# Patient Record
Sex: Female | Born: 1965 | Race: Black or African American | Hispanic: No | Marital: Single | Smoking: Former smoker
Health system: Southern US, Community
[De-identification: ages and names within clinical notes are randomized; demographics above are authoritative.]

## PROBLEM LIST (undated history)

## (undated) DIAGNOSIS — J40 Bronchitis, not specified as acute or chronic: Secondary | ICD-10-CM

## (undated) DIAGNOSIS — I10 Essential (primary) hypertension: Secondary | ICD-10-CM

## (undated) DIAGNOSIS — R7303 Prediabetes: Secondary | ICD-10-CM

## (undated) DIAGNOSIS — E669 Obesity, unspecified: Secondary | ICD-10-CM

## (undated) DIAGNOSIS — E785 Hyperlipidemia, unspecified: Secondary | ICD-10-CM

## (undated) DIAGNOSIS — Z8049 Family history of malignant neoplasm of other genital organs: Secondary | ICD-10-CM

## (undated) DIAGNOSIS — Z803 Family history of malignant neoplasm of breast: Secondary | ICD-10-CM

## (undated) DIAGNOSIS — M1990 Unspecified osteoarthritis, unspecified site: Secondary | ICD-10-CM

## (undated) DIAGNOSIS — C55 Malignant neoplasm of uterus, part unspecified: Secondary | ICD-10-CM

## (undated) DIAGNOSIS — C801 Malignant (primary) neoplasm, unspecified: Secondary | ICD-10-CM

## (undated) HISTORY — DX: Hyperlipidemia, unspecified: E78.5

## (undated) HISTORY — PX: BREAST SURGERY: SHX581

## (undated) HISTORY — PX: REDUCTION MAMMAPLASTY: SUR839

## (undated) HISTORY — DX: Family history of malignant neoplasm of breast: Z80.3

## (undated) HISTORY — DX: Family history of malignant neoplasm of other genital organs: Z80.49

## (undated) HISTORY — DX: Malignant neoplasm of uterus, part unspecified: C55

## (undated) HISTORY — DX: Prediabetes: R73.03

## (undated) HISTORY — PX: CHOLECYSTECTOMY: SHX55

---

## 1998-03-01 ENCOUNTER — Inpatient Hospital Stay (HOSPITAL_COMMUNITY): Admission: AD | Admit: 1998-03-01 | Discharge: 1998-03-04 | Payer: Self-pay | Admitting: Obstetrics

## 1998-03-12 ENCOUNTER — Encounter: Admission: RE | Admit: 1998-03-12 | Discharge: 1998-03-12 | Payer: Self-pay | Admitting: Obstetrics

## 1998-10-15 ENCOUNTER — Emergency Department (HOSPITAL_COMMUNITY): Admission: EM | Admit: 1998-10-15 | Discharge: 1998-10-15 | Payer: Self-pay | Admitting: Emergency Medicine

## 2002-05-14 ENCOUNTER — Emergency Department (HOSPITAL_COMMUNITY): Admission: EM | Admit: 2002-05-14 | Discharge: 2002-05-15 | Payer: Self-pay | Admitting: *Deleted

## 2002-07-01 ENCOUNTER — Emergency Department (HOSPITAL_COMMUNITY): Admission: EM | Admit: 2002-07-01 | Discharge: 2002-07-01 | Payer: Self-pay | Admitting: Emergency Medicine

## 2003-03-11 ENCOUNTER — Emergency Department (HOSPITAL_COMMUNITY): Admission: EM | Admit: 2003-03-11 | Discharge: 2003-03-11 | Payer: Self-pay | Admitting: Emergency Medicine

## 2003-07-19 ENCOUNTER — Emergency Department (HOSPITAL_COMMUNITY): Admission: EM | Admit: 2003-07-19 | Discharge: 2003-07-19 | Payer: Self-pay | Admitting: Emergency Medicine

## 2004-05-20 ENCOUNTER — Ambulatory Visit (HOSPITAL_COMMUNITY): Admission: RE | Admit: 2004-05-20 | Discharge: 2004-05-20 | Payer: Self-pay | Admitting: Family Medicine

## 2004-08-25 ENCOUNTER — Emergency Department (HOSPITAL_COMMUNITY): Admission: EM | Admit: 2004-08-25 | Discharge: 2004-08-25 | Payer: Self-pay | Admitting: Emergency Medicine

## 2005-07-01 ENCOUNTER — Emergency Department (HOSPITAL_COMMUNITY): Admission: EM | Admit: 2005-07-01 | Discharge: 2005-07-01 | Payer: Self-pay | Admitting: Emergency Medicine

## 2006-09-05 ENCOUNTER — Emergency Department (HOSPITAL_COMMUNITY): Admission: EM | Admit: 2006-09-05 | Discharge: 2006-09-05 | Payer: Self-pay | Admitting: Emergency Medicine

## 2006-09-27 ENCOUNTER — Ambulatory Visit (HOSPITAL_COMMUNITY): Admission: RE | Admit: 2006-09-27 | Discharge: 2006-09-27 | Payer: Self-pay | Admitting: Orthopaedic Surgery

## 2007-03-22 ENCOUNTER — Emergency Department (HOSPITAL_COMMUNITY): Admission: EM | Admit: 2007-03-22 | Discharge: 2007-03-22 | Payer: Self-pay | Admitting: Emergency Medicine

## 2007-12-19 ENCOUNTER — Ambulatory Visit (HOSPITAL_COMMUNITY): Admission: RE | Admit: 2007-12-19 | Discharge: 2007-12-19 | Payer: Self-pay | Admitting: Orthopaedic Surgery

## 2007-12-31 ENCOUNTER — Encounter (HOSPITAL_COMMUNITY): Admission: RE | Admit: 2007-12-31 | Discharge: 2008-01-30 | Payer: Self-pay | Admitting: Orthopaedic Surgery

## 2009-07-22 ENCOUNTER — Emergency Department (HOSPITAL_COMMUNITY): Admission: EM | Admit: 2009-07-22 | Discharge: 2009-07-22 | Payer: Self-pay | Admitting: Emergency Medicine

## 2010-06-11 ENCOUNTER — Emergency Department (HOSPITAL_COMMUNITY): Admission: EM | Admit: 2010-06-11 | Discharge: 2010-06-11 | Payer: Self-pay | Admitting: Emergency Medicine

## 2010-08-01 ENCOUNTER — Emergency Department (HOSPITAL_COMMUNITY)
Admission: EM | Admit: 2010-08-01 | Discharge: 2010-08-01 | Payer: Self-pay | Source: Home / Self Care | Admitting: Emergency Medicine

## 2011-04-06 ENCOUNTER — Encounter: Payer: Self-pay | Admitting: *Deleted

## 2011-04-06 ENCOUNTER — Emergency Department (HOSPITAL_COMMUNITY)
Admission: EM | Admit: 2011-04-06 | Discharge: 2011-04-06 | Disposition: A | Discharge status: Home / Self Care | Payer: Self-pay | Attending: Emergency Medicine | Admitting: Emergency Medicine

## 2011-04-06 DIAGNOSIS — Y9210 Unspecified residential institution as the place of occurrence of the external cause: Secondary | ICD-10-CM | POA: Insufficient documentation

## 2011-04-06 DIAGNOSIS — I10 Essential (primary) hypertension: Secondary | ICD-10-CM | POA: Insufficient documentation

## 2011-04-06 DIAGNOSIS — S199XXA Unspecified injury of neck, initial encounter: Secondary | ICD-10-CM

## 2011-04-06 DIAGNOSIS — J45909 Unspecified asthma, uncomplicated: Secondary | ICD-10-CM | POA: Insufficient documentation

## 2011-04-06 DIAGNOSIS — S0993XA Unspecified injury of face, initial encounter: Secondary | ICD-10-CM | POA: Insufficient documentation

## 2011-04-06 DIAGNOSIS — S8000XA Contusion of unspecified knee, initial encounter: Secondary | ICD-10-CM | POA: Insufficient documentation

## 2011-04-06 HISTORY — DX: Essential (primary) hypertension: I10
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
## 2011-07-20 ENCOUNTER — Encounter (HOSPITAL_COMMUNITY): Payer: Self-pay | Admitting: *Deleted

## 2011-07-20 ENCOUNTER — Emergency Department (HOSPITAL_COMMUNITY)
Admission: EM | Admit: 2011-07-20 | Discharge: 2011-07-20 | Disposition: A | Discharge status: Home / Self Care | Payer: Medicaid Other | Attending: Emergency Medicine | Admitting: Emergency Medicine

## 2011-07-20 DIAGNOSIS — I10 Essential (primary) hypertension: Secondary | ICD-10-CM | POA: Insufficient documentation

## 2011-07-20 DIAGNOSIS — J111 Influenza due to unidentified influenza virus with other respiratory manifestations: Secondary | ICD-10-CM | POA: Insufficient documentation

## 2011-07-20 DIAGNOSIS — R059 Cough, unspecified: Secondary | ICD-10-CM | POA: Insufficient documentation

## 2011-07-20 DIAGNOSIS — R509 Fever, unspecified: Secondary | ICD-10-CM | POA: Insufficient documentation

## 2011-07-20 DIAGNOSIS — R05 Cough: Secondary | ICD-10-CM | POA: Insufficient documentation

## 2011-07-20 MED ORDER — OSELTAMIVIR PHOSPHATE 75 MG PO CAPS: 75.0000 mg | ORAL_CAPSULE | Freq: Two times a day (BID) | ORAL | Status: AC

## 2011-07-20 MED ORDER — ACETAMINOPHEN 500 MG PO TABS
1000.0000 mg | ORAL_TABLET | Freq: Once | ORAL | Status: AC
  Administered 2011-07-20: 1000 mg via ORAL
  Filled 2011-07-20: qty 2

## 2011-07-20 MED ORDER — OSELTAMIVIR PHOSPHATE 75 MG PO CAPS
75.0000 mg | ORAL_CAPSULE | Freq: Once | ORAL | Status: AC
  Administered 2011-07-20: 75 mg via ORAL
  Filled 2011-07-20: qty 1

## 2011-07-20 MED ORDER — IBUPROFEN 800 MG PO TABS
800.0000 mg | ORAL_TABLET | Freq: Once | ORAL | Status: AC
  Administered 2011-07-20: 800 mg via ORAL
  Filled 2011-07-20: qty 1

## 2011-07-20 NOTE — ED Provider Notes (Signed)
History     CSN: [id number] Arrival date & time: 07/20/2011  7:32 PM   First MD Initiated Contact with Patient 07/20/11 2045      Chief Complaint  Patient presents with  . Influenza    (Consider location/radiation/quality/duration/timing/severity/associated sxs/prior treatment) Patient is a 45 y.o. Allison [name] presenting with flu symptoms. The history is provided by the patient. No language interpreter was used.  Influenza This is a new problem. The current episode started yesterday. The problem occurs constantly. The problem has been unchanged. Associated symptoms include chills, coughing, a fever and myalgias.    Past Medical History  Diagnosis Date  . Hypertension   . Asthma     Past Surgical History  Procedure Date  . Breast surgery   . Cholecystectomy     No family history on file.  History  Substance Use Topics  . Smoking status: Never Smoker   . Smokeless tobacco: Not on file  . Alcohol Use: No    OB History    Grav Para Term Preterm Abortions TAB SAB Ect Mult Living                  Review of Systems  Constitutional: Positive for fever and chills.  Respiratory: Positive for cough.   Musculoskeletal: Positive for myalgias.  All other systems reviewed and are negative.    Allergies  Review of patient's allergies indicates no known allergies.  Home Medications   Current Outpatient Rx  Name Route Sig Dispense Refill  . ALBUTEROL SULFATE HFA 108 (90 BASE) MCG/ACT IN AERS Inhalation Inhale 2 puffs into the lungs 4 (four) times daily as needed. Tightness in chest and/or shortness of breath     . GUAIFENESIN-CODEINE 100-10 MG/5ML PO SYRP Oral Take 5 mLs by mouth 3 (three) times daily as needed. FOR COUGH     . HYDROCHLOROTHIAZIDE 25 MG PO TABS Oral Take 25 mg by mouth daily.        BP 114/97  Pulse 133  Temp(Src) 100.9 F (38.3 C) (Oral)  Resp 24  Ht 5\' 1"  (1.549 m)  Wt 220 lb (99.791 kg)  BMI Allison.57 kg/m2  SpO2 98%  LMP 07/13/2011  Physical  Exam  Nursing note and vitals reviewed. Constitutional: She is oriented to person, place, and time. She appears well-developed and well-nourished. No distress.  HENT:  Head: Normocephalic and atraumatic.  Right Ear: External ear normal.  Left Ear: External ear normal.  Nose: Nose normal.  Mouth/Throat: No oropharyngeal exudate.  Eyes: EOM are normal.  Neck: Normal range of motion.  Cardiovascular: Normal rate, regular rhythm and normal heart sounds.   Pulmonary/Chest: Effort normal and breath sounds normal. No accessory muscle usage. Not tachypneic. No respiratory distress. She has no decreased breath sounds. She has no wheezes. She has no rhonchi. She has no rales. She exhibits no tenderness.  Abdominal: Soft. She exhibits no distension. There is no tenderness.  Musculoskeletal: Normal range of motion.  Lymphadenopathy:    She has no cervical adenopathy.  Neurological: She is alert and oriented to person, place, and time.  Skin: Skin is warm and dry. She is not diaphoretic.  Psychiatric: She has a normal mood and affect. Judgment normal.    ED Course  Procedures (including critical care time)  Labs Reviewed - No data to display No results found.   No diagnosis found.    MDM          Worthy Rancher, PA 07/20/11 2109

## 2011-07-20 NOTE — ED Notes (Signed)
Cough,fever, chills,scratchy throat.  bil ear ache.

## 2011-07-20 NOTE — ED Notes (Signed)
Flu symptoms

## 2011-07-20 NOTE — ED Notes (Signed)
Alert, talking,sick since yesterday with fever, cough chills

## 2011-07-22 NOTE — ED Provider Notes (Signed)
Medical screening examination/treatment/procedure(s) were performed by non-physician practitioner and as supervising physician I was immediately available for consultation/collaboration.  Kashmere Daywalt S. Inell Mimbs, MD 07/22/11 0443 

## 2011-08-17 ENCOUNTER — Emergency Department (HOSPITAL_COMMUNITY): Payer: Medicaid Other

## 2011-08-17 ENCOUNTER — Other Ambulatory Visit: Payer: Self-pay

## 2011-08-17 ENCOUNTER — Encounter (HOSPITAL_COMMUNITY): Payer: Self-pay | Admitting: *Deleted

## 2011-08-17 ENCOUNTER — Emergency Department (HOSPITAL_COMMUNITY)
Admission: EM | Admit: 2011-08-17 | Discharge: 2011-08-17 | Disposition: A | Discharge status: Home / Self Care | Payer: Medicaid Other | Attending: Emergency Medicine | Admitting: Emergency Medicine

## 2011-08-17 DIAGNOSIS — R5381 Other malaise: Secondary | ICD-10-CM | POA: Insufficient documentation

## 2011-08-17 DIAGNOSIS — R29898 Other symptoms and signs involving the musculoskeletal system: Secondary | ICD-10-CM | POA: Insufficient documentation

## 2011-08-17 DIAGNOSIS — R51 Headache: Secondary | ICD-10-CM | POA: Insufficient documentation

## 2011-08-17 DIAGNOSIS — R5383 Other fatigue: Secondary | ICD-10-CM | POA: Insufficient documentation

## 2011-08-17 DIAGNOSIS — R404 Transient alteration of awareness: Secondary | ICD-10-CM | POA: Insufficient documentation

## 2011-08-17 DIAGNOSIS — R209 Unspecified disturbances of skin sensation: Secondary | ICD-10-CM | POA: Insufficient documentation

## 2011-08-17 DIAGNOSIS — I10 Essential (primary) hypertension: Secondary | ICD-10-CM | POA: Insufficient documentation

## 2011-08-17 DIAGNOSIS — R202 Paresthesia of skin: Secondary | ICD-10-CM

## 2011-08-17 DIAGNOSIS — J45909 Unspecified asthma, uncomplicated: Secondary | ICD-10-CM | POA: Insufficient documentation

## 2011-08-17 LAB — DIFFERENTIAL
Basophils Absolute: 0 10*3/uL (ref 0.0–0.1)
Basophils Relative: 0 % (ref 0–1)
Eosinophils Absolute: 0.1 10*3/uL (ref 0.0–0.7)
Eosinophils Relative: 2 % (ref 0–5)
Lymphocytes Relative: 45 % (ref 12–46)
Lymphs Abs: 3.6 10*3/uL (ref 0.7–4.0)
Monocytes Absolute: 0.5 10*3/uL (ref 0.1–1.0)
Monocytes Relative: 6 % (ref 3–12)
Neutro Abs: 3.7 10*3/uL (ref 1.7–7.7)
Neutrophils Relative %: 47 % (ref 43–77)

## 2011-08-17 LAB — CBC
HCT: 37.1 % (ref 36.0–46.0)
Hemoglobin: 12.3 g/dL (ref 12.0–15.0)
MCH: 30 pg (ref 26.0–34.0)
MCHC: 33.2 g/dL (ref 30.0–36.0)
MCV: 90.5 fL (ref 78.0–100.0)
Platelets: 399 10*3/uL (ref 150–400)
RBC: 4.1 MIL/uL (ref 3.87–5.11)
RDW: 14 % (ref 11.5–15.5)
WBC: 7.9 10*3/uL (ref 4.0–10.5)

## 2011-08-17 LAB — CARDIAC PANEL(CRET KIN+CKTOT+MB+TROPI)
CK, MB: 1.1 ng/mL (ref 0.3–4.0)
Relative Index: INVALID (ref 0.0–2.5)
Relative Index: INVALID (ref 0.0–2.5)
Total CK: 80 U/L (ref 7–177)
Total CK: 82 U/L (ref 7–177)
Troponin I: <0.3 ng/mL (ref <0.30)

## 2011-08-17 LAB — URINALYSIS, ROUTINE W REFLEX MICROSCOPIC
Bilirubin Urine: NEGATIVE
Glucose, UA: NEGATIVE mg/dL
Ketones, ur: NEGATIVE mg/dL
Leukocytes, UA: NEGATIVE
Nitrite: NEGATIVE
Protein, ur: NEGATIVE mg/dL
Specific Gravity, Urine: >1.03 — ABNORMAL HIGH (ref 1.005–1.030)
Urobilinogen, UA: 0.2 mg/dL (ref 0.0–1.0)
pH: 5.5 (ref 5.0–8.0)

## 2011-08-17 LAB — URINE MICROSCOPIC-ADD ON

## 2011-08-17 LAB — BASIC METABOLIC PANEL
BUN: 7 mg/dL (ref 6–23)
CO2: 31 mEq/L (ref 19–32)
Calcium: 9.8 mg/dL (ref 8.4–10.5)
Chloride: 99 mEq/L (ref 96–112)
Creatinine, Ser: 0.64 mg/dL (ref 0.50–1.10)
GFR calc Af Amer: >90 mL/min (ref >90)
GFR calc non Af Amer: >90 mL/min (ref >90)
Glucose, Bld: 119 mg/dL — ABNORMAL HIGH (ref 70–99)
Potassium: 3.3 mEq/L — ABNORMAL LOW (ref 3.5–5.1)
Sodium: 139 mEq/L (ref 135–145)

## 2011-08-17 LAB — PROTIME-INR
INR: 0.95 (ref 0.00–1.49)
Prothrombin Time: 12.9 seconds (ref 11.6–15.2)

## 2011-08-17 LAB — GLUCOSE, CAPILLARY: Glucose-Capillary: 115 mg/dL — ABNORMAL HIGH (ref 70–99)

## 2011-08-17 MED ORDER — GADOBENATE DIMEGLUMINE 529 MG/ML IV SOLN
20.0000 mL | Freq: Once | INTRAVENOUS | Status: AC | PRN
  Administered 2011-08-17: 20 mL via INTRAVENOUS

## 2011-08-17 NOTE — ED Notes (Signed)
ltt facial numbness.  Lt arm weak.

## 2011-08-17 NOTE — ED Provider Notes (Signed)
History   This chart was scribed for Glynn Octave, MD by Clarita Crane. The patient was seen in room [facility] and the patient's care was started at 5:44PM.   CSN: [id number]  Arrival date & time 08/17/11  1725   First MD Initiated Contact with Patient 08/17/11 1740      Chief Complaint  Patient presents with  . Numbness    (Consider location/radiation/quality/duration/timing/severity/associated sxs/prior treatment) HPI Allison [name] is a 46 y.o. female who presents to the Emergency Department complaining of constant moderate to severe left sided facial numbness radiating to LUE onset 2.5 hours ago while resting and persistent since with associated moderate to severe generalized weakness and drowsiness. Denies chest pain, SOB, diaphoresis. Patient with h/o HTN, Asthma.   Past Medical History  Diagnosis Date  . Hypertension   . Asthma     Past Surgical History  Procedure Date  . Breast surgery   . Cholecystectomy     History reviewed. No pertinent family history.  History  Substance Use Topics  . Smoking status: Never Smoker   . Smokeless tobacco: Not on file  . Alcohol Use: No    OB History    Grav Para Term Preterm Abortions TAB SAB Ect Mult Living                  Review of Systems 10 Systems reviewed and are negative for acute change except as noted in the HPI.  Allergies  Review of patient's allergies indicates no known allergies.  Home Medications   Current Outpatient Rx  Name Route Sig Dispense Refill  . ALBUTEROL SULFATE HFA 108 (90 BASE) MCG/ACT IN AERS Inhalation Inhale 2 puffs into the lungs 4 (four) times daily as needed. Tightness in chest and/or shortness of breath     . HYDROCHLOROTHIAZIDE 25 MG PO TABS Oral Take 25 mg by mouth daily.        BP 130/83  Pulse 94  Temp(Src) 97.7 F (36.5 C) (Oral)  Resp 20  SpO2 95%  LMP 08/17/2011  Physical Exam  Nursing note and vitals reviewed. Constitutional: She is oriented to person, place,  and time. She appears well-developed and well-nourished. No distress.  HENT:  Head: Normocephalic and atraumatic.  Eyes: EOM are normal. Pupils are equal, round, and reactive to light.  Neck: Neck supple. No tracheal deviation present.  Cardiovascular: Normal rate and regular rhythm.  Exam reveals no gallop and no friction rub.   No murmur heard. Pulmonary/Chest: Effort normal. No respiratory distress. She has no wheezes. She has no rales.  Abdominal: Soft. She exhibits no distension.  Musculoskeletal: Normal range of motion. She exhibits no edema.  Neurological: She is alert and oriented to person, place, and time. She has normal strength. No cranial nerve deficit or sensory deficit. Coordination normal.       No facial droop. No nystagmus. No pronator drift. Grip strength normal and equal bilaterally. Normal finger to nose. Positive Romberg. Subjective decreased pinprick to left side of face. No ataxia.   Skin: Skin is warm and dry.  Psychiatric: She has a normal mood and affect. Her behavior is normal.    ED Course  Procedures (including critical care time)  DIAGNOSTIC STUDIES: Oxygen Saturation is 97% on room air, normal by my interpretation.    COORDINATION OF CARE: 5:46PM- Patient informed of current plan for treatment in ED and agrees with plan set forth at this time.  9:16PM- Patient reports that she is still experiencing numbness to left  side of face and is very drowsy at this time.  Advised that we are still waiting on results of MRI.  10:04PM- Patient informed of MRI results which rule out possible stroke. Patient advised of need to follow up with neurologist regarding symptoms.   Labs Reviewed  BASIC METABOLIC PANEL - Abnormal; Notable for the following:    Potassium 3.3 (*)    Glucose, Bld 119 (*)    All other components within normal limits  URINALYSIS, ROUTINE W REFLEX MICROSCOPIC - Abnormal; Notable for the following:    Specific Gravity, Urine >1.030 (*)    Hgb  urine dipstick SMALL (*)    All other components within normal limits  GLUCOSE, CAPILLARY - Abnormal; Notable for the following:    Glucose-Capillary 115 (*)    All other components within normal limits  CBC  DIFFERENTIAL  PROTIME-INR  CARDIAC PANEL(CRET KIN+CKTOT+MB+TROPI)  CARDIAC PANEL(CRET KIN+CKTOT+MB+TROPI)  URINE MICROSCOPIC-ADD ON  POCT CBG MONITORING   Ct Head Wo Contrast  08/17/2011  *RADIOLOGY REPORT*  Clinical Data: Left-sided facial numbness.  No known injury.  CT HEAD WITHOUT CONTRAST  Technique:  Contiguous axial images were obtained from the base of the skull through the vertex without contrast.  Comparison: 07/01/2005.  Findings: There is no evidence for acute infarction, intracranial hemorrhage, mass lesion, hydrocephalus, or extra-axial fluid. There is no atrophy or white matter disease.  Calvarium is intact. The sinuses and mastoids are clear.  No infratemporal mass lesion is observed.  IMPRESSION: Negative.  No change from prior normal study.  Original Report Authenticated By: Elsie Stain, M.D.   Mr Angiogram Almyra Deforest Wo Contrast  08/17/2011  *RADIOLOGY REPORT*  Clinical Data:  Left arm weakness.  Facial numbness.  Rule out stroke  MRI HEAD WITHOUT AND WITH CONTRAST MRA HEAD WITHOUT CONTRAST MRA NECK WITHOUT AND WITH CONTRAST  Technique:  Multiplanar, multiecho pulse sequences of the brain and surrounding structures were obtained without and with intravenous contrast.  Angiographic images of the Circle of Willis were obtained using MRA technique without intravenous contrast. Angiographic images of the neck were obtained using MRA technique without and with intravenous contrast.  Carotid stenosis measurements (when applicable) are obtained utilizing NASCET criteria, using the distal internal carotid diameter as the denominator.  Contrast: 20mL MULTIHANCE GADOBENATE DIMEGLUMINE 529 MG/ML IV SOLN  Comparison:  CT 08/17/2011  MRI HEAD  Findings:  Negative for acute infarct.   Negative for significant chronic ischemia.  Negative for demyelinating disease.  Cerebral white matter is normal.  Brainstem is normal.  Negative for hemorrhage or mass lesion.  Postcontrast imaging of the brain reveals normal enhancement.  IMPRESSION: Negative  MRA HEAD  Findings: Both vertebral arteries are patent to the basilar.  Right PICA is patent.  Left PICA not visualized.  Left AICA is patent. Small right AICA.  Superior cerebellar and posterior cerebral arteries are patent bilaterally.  Internal carotid artery is patent bilaterally without stenosis. Posterior communicating artery is patent bilaterally.  Anterior and middle cerebral arteries are widely patent without significant stenosis.  Negative for cerebral aneurysm.  IMPRESSION: Negative  MRA NECK  Findings: Carotid bifurcation is widely patent bilaterally.  No evidence of atherosclerotic disease or dissection.  No significant carotid stenosis.  Both vertebral arteries are equal in size and are widely patent to the basilar without stenosis.  IMPRESSION: Negative  Original Report Authenticated By: Camelia Phenes, M.D.   Mr Angiogram Neck W Wo Contrast  08/17/2011  *RADIOLOGY REPORT*  Clinical Data:  Left arm weakness.  Facial numbness.  Rule out stroke  MRI HEAD WITHOUT AND WITH CONTRAST MRA HEAD WITHOUT CONTRAST MRA NECK WITHOUT AND WITH CONTRAST  Technique:  Multiplanar, multiecho pulse sequences of the brain and surrounding structures were obtained without and with intravenous contrast.  Angiographic images of the Circle of Willis were obtained using MRA technique without intravenous contrast. Angiographic images of the neck were obtained using MRA technique without and with intravenous contrast.  Carotid stenosis measurements (when applicable) are obtained utilizing NASCET criteria, using the distal internal carotid diameter as the denominator.  Contrast: 20mL MULTIHANCE GADOBENATE DIMEGLUMINE 529 MG/ML IV SOLN  Comparison:  CT 08/17/2011  MRI  HEAD  Findings:  Negative for acute infarct.  Negative for significant chronic ischemia.  Negative for demyelinating disease.  Cerebral white matter is normal.  Brainstem is normal.  Negative for hemorrhage or mass lesion.  Postcontrast imaging of the brain reveals normal enhancement.  IMPRESSION: Negative  MRA HEAD  Findings: Both vertebral arteries are patent to the basilar.  Right PICA is patent.  Left PICA not visualized.  Left AICA is patent. Small right AICA.  Superior cerebellar and posterior cerebral arteries are patent bilaterally.  Internal carotid artery is patent bilaterally without stenosis. Posterior communicating artery is patent bilaterally.  Anterior and middle cerebral arteries are widely patent without significant stenosis.  Negative for cerebral aneurysm.  IMPRESSION: Negative  MRA NECK  Findings: Carotid bifurcation is widely patent bilaterally.  No evidence of atherosclerotic disease or dissection.  No significant carotid stenosis.  Both vertebral arteries are equal in size and are widely patent to the basilar without stenosis.  IMPRESSION: Negative  Original Report Authenticated By: Camelia Phenes, M.D.   Mr Laqueta Jean QM Contrast  08/17/2011  *RADIOLOGY REPORT*  Clinical Data:  Left arm weakness.  Facial numbness.  Rule out stroke  MRI HEAD WITHOUT AND WITH CONTRAST MRA HEAD WITHOUT CONTRAST MRA NECK WITHOUT AND WITH CONTRAST  Technique:  Multiplanar, multiecho pulse sequences of the brain and surrounding structures were obtained without and with intravenous contrast.  Angiographic images of the Circle of Willis were obtained using MRA technique without intravenous contrast. Angiographic images of the neck were obtained using MRA technique without and with intravenous contrast.  Carotid stenosis measurements (when applicable) are obtained utilizing NASCET criteria, using the distal internal carotid diameter as the denominator.  Contrast: 20mL MULTIHANCE GADOBENATE DIMEGLUMINE 529 MG/ML IV  SOLN  Comparison:  CT 08/17/2011  MRI HEAD  Findings:  Negative for acute infarct.  Negative for significant chronic ischemia.  Negative for demyelinating disease.  Cerebral white matter is normal.  Brainstem is normal.  Negative for hemorrhage or mass lesion.  Postcontrast imaging of the brain reveals normal enhancement.  IMPRESSION: Negative  MRA HEAD  Findings: Both vertebral arteries are patent to the basilar.  Right PICA is patent.  Left PICA not visualized.  Left AICA is patent. Small right AICA.  Superior cerebellar and posterior cerebral arteries are patent bilaterally.  Internal carotid artery is patent bilaterally without stenosis. Posterior communicating artery is patent bilaterally.  Anterior and middle cerebral arteries are widely patent without significant stenosis.  Negative for cerebral aneurysm.  IMPRESSION: Negative  MRA NECK  Findings: Carotid bifurcation is widely patent bilaterally.  No evidence of atherosclerotic disease or dissection.  No significant carotid stenosis.  Both vertebral arteries are equal in size and are widely patent to the basilar without stenosis.  IMPRESSION: Negative  Original Report Authenticated By: DAVID C.  Chestine Spore, M.D.     1. Paresthesia       MDM  Subjective left-sided facial numbness for 2 hours with pain radiating to left side of face, neck and left arm. Did have fleeting chest pain that resolved. Patient feels subjectively weak in the left arm but not appreciate objectively.  She does have positive Romberg and is somewhat difficult historian and not very cooperative with neurological exam. Patient is not a TPA candidate given the minimal nature of her symptoms.  CT negative for acute abnormality. MRI negative for stroke, no evidence of stenosis or aneurysm.  Patient states paresthesias have improved but now she has pain in the left side of her face reading that her neck. Her left parotid gland is tender to palpation there is no dental  abnormality.  Given constellation of symptoms, doubt TIA. She stable for outpatient followup.   Date: 08/17/2011  Rate: 87  Rhythm: normal sinus rhythm  QRS Axis: normal  Intervals: normal  ST/T Wave abnormalities: nonspecific T wave changes  Conduction Disutrbances:none  Narrative Interpretation: T wave inversions V1 and V2, no comparison available  Old EKG Reviewed: none available      Medical screening examination/treatment/procedure(s) were performed by non-physician practitioner and as supervising physician I was immediately available for consultation/collaboration.            Glynn Octave, MD 08/17/11 [phone]

## 2011-08-17 NOTE — ED Notes (Signed)
Pt remains in MRI, report taken from Eligah East, RN

## 2012-03-01 ENCOUNTER — Emergency Department (HOSPITAL_COMMUNITY)
Admission: EM | Admit: 2012-03-01 | Discharge: 2012-03-01 | Disposition: A | Discharge status: Home / Self Care | Payer: Medicaid Other | Attending: Emergency Medicine | Admitting: Emergency Medicine

## 2012-03-01 ENCOUNTER — Encounter (HOSPITAL_COMMUNITY): Payer: Self-pay

## 2012-03-01 ENCOUNTER — Emergency Department (HOSPITAL_COMMUNITY): Payer: Medicaid Other

## 2012-03-01 DIAGNOSIS — I10 Essential (primary) hypertension: Secondary | ICD-10-CM | POA: Insufficient documentation

## 2012-03-01 DIAGNOSIS — J40 Bronchitis, not specified as acute or chronic: Secondary | ICD-10-CM

## 2012-03-01 DIAGNOSIS — J45909 Unspecified asthma, uncomplicated: Secondary | ICD-10-CM | POA: Insufficient documentation

## 2012-03-01 DIAGNOSIS — J069 Acute upper respiratory infection, unspecified: Secondary | ICD-10-CM | POA: Insufficient documentation

## 2012-03-01 MED ORDER — IPRATROPIUM BROMIDE 0.02 % IN SOLN
0.5000 mg | Freq: Once | RESPIRATORY_TRACT | Status: AC
  Administered 2012-03-01: 0.5 mg via RESPIRATORY_TRACT
  Filled 2012-03-01: qty 2.5

## 2012-03-01 MED ORDER — PROMETHAZINE-CODEINE 6.25-10 MG/5ML PO SYRP: 5.0000 mL | ORAL_SOLUTION | ORAL | Status: AC | PRN

## 2012-03-01 MED ORDER — PSEUDOEPHEDRINE HCL 60 MG PO TABS: ORAL_TABLET | ORAL | Status: DC

## 2012-03-01 MED ORDER — PREDNISONE 20 MG PO TABS
60.0000 mg | ORAL_TABLET | Freq: Once | ORAL | Status: AC
  Administered 2012-03-01: 60 mg via ORAL
  Filled 2012-03-01: qty 3

## 2012-03-01 MED ORDER — ALBUTEROL SULFATE (5 MG/ML) 0.5% IN NEBU
2.5000 mg | INHALATION_SOLUTION | Freq: Once | RESPIRATORY_TRACT | Status: AC
Start: 2012-03-01 — End: 2012-03-01
  Administered 2012-03-01: 2.5 mg via RESPIRATORY_TRACT
  Filled 2012-03-01: qty 0.5

## 2012-03-01 MED ORDER — HYDROCOD POLST-CHLORPHEN POLST 10-8 MG/5ML PO LQCR
5.0000 mL | Freq: Once | ORAL | Status: AC
  Administered 2012-03-01: 5 mL via ORAL
  Filled 2012-03-01: qty 5

## 2012-03-01 MED ORDER — ALBUTEROL SULFATE HFA 108 (90 BASE) MCG/ACT IN AERS: 2.0000 | INHALATION_SPRAY | RESPIRATORY_TRACT | Status: DC | PRN

## 2012-03-01 NOTE — ED Notes (Signed)
Pt reports being sick w/sore throat, cough, fever off/on, and bil earaches since sat. "feel like i'm getting worse".

## 2012-03-01 NOTE — ED Provider Notes (Signed)
History     CSN: [id number]  Arrival date & time 03/01/12  1000   First MD Initiated Contact with Patient 03/01/12 1026      Chief Complaint  Patient presents with  . Cough  . Sore Throat  . Fever  . Otalgia    (Consider location/radiation/quality/duration/timing/severity/associated sxs/prior treatment) Patient is a 46 y.o. female presenting with cough, pharyngitis, fever, and ear pain. The history is provided by the patient.  Cough This is a new problem. The current episode started more than 1 week ago. The problem occurs every few minutes. The problem has not changed since onset.The cough is non-productive. Associated symptoms include chills, sweats, ear pain, headaches, rhinorrhea, sore throat and myalgias. Pertinent negatives include no chest pain, no shortness of breath and no wheezing. She has tried decongestants and cough syrup for the symptoms. She is not a smoker. Her past medical history is significant for bronchitis and asthma.  Sore Throat Associated symptoms include chills, coughing, a fever, headaches, myalgias and a sore throat. Pertinent negatives include no abdominal pain, arthralgias, chest pain or neck pain.  Fever Primary symptoms of the febrile illness include fever, headaches, cough and myalgias. Primary symptoms do not include wheezing, shortness of breath, abdominal pain, dysuria or arthralgias.  The headache is not associated with photophobia.  Otalgia Associated symptoms include headaches, rhinorrhea, sore throat and cough. Pertinent negatives include no abdominal pain and no neck pain.    Past Medical History  Diagnosis Date  . Hypertension   . Asthma     Past Surgical History  Procedure Date  . Breast surgery   . Cholecystectomy     No family history on file.  History  Substance Use Topics  . Smoking status: Never Smoker   . Smokeless tobacco: Not on file  . Alcohol Use: No    OB History    Grav Para Term Preterm Abortions TAB SAB Ect  Mult Living                  Review of Systems  Constitutional: Positive for fever and chills. Negative for activity change.       All ROS Neg except as noted in HPI  HENT: Positive for ear pain, sore throat and rhinorrhea. Negative for nosebleeds and neck pain.   Eyes: Negative for photophobia and discharge.  Respiratory: Positive for cough. Negative for shortness of breath and wheezing.   Cardiovascular: Negative for chest pain and palpitations.  Gastrointestinal: Negative for abdominal pain and blood in stool.  Genitourinary: Negative for dysuria, frequency and hematuria.  Musculoskeletal: Positive for myalgias. Negative for back pain and arthralgias.  Skin: Negative.   Neurological: Positive for headaches. Negative for dizziness, seizures and speech difficulty.  Psychiatric/Behavioral: Negative for hallucinations and confusion.    Allergies  Review of patient's allergies indicates no known allergies.  Home Medications   Current Outpatient Rx  Name Route Sig Dispense Refill  . ALBUTEROL SULFATE HFA 108 (90 BASE) MCG/ACT IN AERS Inhalation Inhale 2 puffs into the lungs 4 (four) times daily as needed. Tightness in chest and/or shortness of breath     . HYDROCHLOROTHIAZIDE 25 MG PO TABS Oral Take 25 mg by mouth daily.        BP 123/80  Pulse 96  Temp 98.3 F (36.8 C) (Oral)  Resp 20  Ht 5\' 1"  (1.549 m)  Wt 225 lb (102.059 kg)  BMI 42.51 kg/m2  SpO2 98%  LMP 02/29/2012  Physical Exam  Nursing note and  vitals reviewed. Constitutional: She is oriented to person, place, and time. She appears well-developed and well-nourished.  Non-toxic appearance.  HENT:  Head: Normocephalic.  Right Ear: Tympanic membrane and external ear normal.  Left Ear: Tympanic membrane and external ear normal.       Nasal congestion. Pt is hoarse.  Eyes: EOM and lids are normal. Pupils are equal, round, and reactive to light.  Neck: Normal range of motion. Neck supple. Carotid bruit is not  present.       Sore with ROM exercises, no rigidity.  Cardiovascular: Normal rate, regular rhythm, normal heart sounds, intact distal pulses and normal pulses.   Pulmonary/Chest: No respiratory distress. She has wheezes. She has rhonchi.  Abdominal: Soft. Bowel sounds are normal. There is no tenderness. There is no guarding.  Musculoskeletal: Normal range of motion.  Lymphadenopathy:       Head (right side): No submandibular adenopathy present.       Head (left side): No submandibular adenopathy present.    She has no cervical adenopathy.  Neurological: She is alert and oriented to person, place, and time. She has normal strength. No cranial nerve deficit or sensory deficit.  Skin: Skin is warm and dry.  Psychiatric: She has a normal mood and affect. Her speech is normal.    ED Course  Procedures (including critical care time)   Labs Reviewed  RAPID STREP SCREEN   No results found.   No diagnosis found.    MDM  I have reviewed nursing notes, vital signs, and all appropriate lab and imaging results for this patient. Chest x-ray is negative for acute problem. Patient treated for bronchitis and upper respiratory infection. Prescription for albuterol 2 puffs every 4 hours, promethazine cough medication 5 mL every 4 hours as needed for cough, and Sudafed 3 times daily for congestion given to the patient. The patient is to follow with her primary physician next week, or return to the emergency department if any problems sooner.       Kathie Dike, [place] 03/01/12 1724

## 2012-03-01 NOTE — ED Notes (Signed)
Pt c/o bilateral ear ache, intermittent fever, sore throat, and cough x 6 days.

## 2012-03-02 NOTE — ED Provider Notes (Signed)
Medical screening examination/treatment/procedure(s) were performed by non-physician practitioner and as supervising physician I was immediately available for consultation/collaboration.  Flint Melter, MD 03/02/12 [id number]

## 2012-05-08 ENCOUNTER — Other Ambulatory Visit (HOSPITAL_COMMUNITY): Payer: Self-pay | Admitting: Nurse Practitioner

## 2012-05-08 DIAGNOSIS — Z139 Encounter for screening, unspecified: Secondary | ICD-10-CM

## 2012-05-15 ENCOUNTER — Ambulatory Visit (HOSPITAL_COMMUNITY)
Admission: RE | Admit: 2012-05-15 | Discharge: 2012-05-15 | Disposition: A | Discharge status: Home / Self Care | Payer: Medicaid Other | Source: Ambulatory Visit | Attending: Nurse Practitioner | Admitting: Nurse Practitioner

## 2012-05-15 DIAGNOSIS — Z139 Encounter for screening, unspecified: Secondary | ICD-10-CM

## 2012-05-15 DIAGNOSIS — Z1231 Encounter for screening mammogram for malignant neoplasm of breast: Secondary | ICD-10-CM | POA: Insufficient documentation

## 2012-08-30 ENCOUNTER — Encounter (HOSPITAL_COMMUNITY): Payer: Self-pay

## 2012-08-30 ENCOUNTER — Emergency Department (HOSPITAL_COMMUNITY): Payer: Medicaid Other

## 2012-08-30 ENCOUNTER — Emergency Department (HOSPITAL_COMMUNITY)
Admission: EM | Admit: 2012-08-30 | Discharge: 2012-08-30 | Disposition: A | Discharge status: Home / Self Care | Payer: Medicaid Other | Attending: Emergency Medicine | Admitting: Emergency Medicine

## 2012-08-30 DIAGNOSIS — J3489 Other specified disorders of nose and nasal sinuses: Secondary | ICD-10-CM | POA: Insufficient documentation

## 2012-08-30 DIAGNOSIS — J45909 Unspecified asthma, uncomplicated: Secondary | ICD-10-CM | POA: Insufficient documentation

## 2012-08-30 DIAGNOSIS — R6889 Other general symptoms and signs: Secondary | ICD-10-CM | POA: Insufficient documentation

## 2012-08-30 DIAGNOSIS — I10 Essential (primary) hypertension: Secondary | ICD-10-CM | POA: Insufficient documentation

## 2012-08-30 DIAGNOSIS — Z3202 Encounter for pregnancy test, result negative: Secondary | ICD-10-CM | POA: Insufficient documentation

## 2012-08-30 DIAGNOSIS — Z79899 Other long term (current) drug therapy: Secondary | ICD-10-CM | POA: Insufficient documentation

## 2012-08-30 DIAGNOSIS — H81399 Other peripheral vertigo, unspecified ear: Secondary | ICD-10-CM | POA: Insufficient documentation

## 2012-08-30 LAB — URINALYSIS, ROUTINE W REFLEX MICROSCOPIC
Glucose, UA: NEGATIVE mg/dL
Protein, ur: NEGATIVE mg/dL
pH: 6.5 (ref 5.0–8.0)

## 2012-08-30 LAB — URINE MICROSCOPIC-ADD ON

## 2012-08-30 LAB — POCT I-STAT, CHEM 8
BUN: 12 mg/dL (ref 6–23)
HCT: 37 % (ref 36.0–46.0)
Hemoglobin: 12.6 g/dL (ref 12.0–15.0)
Sodium: 137 mEq/L (ref 135–145)
TCO2: 30 mmol/L (ref 0–100)

## 2012-08-30 LAB — PREGNANCY, URINE: Preg Test, Ur: NEGATIVE

## 2012-08-30 MED ORDER — ONDANSETRON 8 MG PO TBDP
8.0000 mg | ORAL_TABLET | Freq: Once | ORAL | Status: AC | PRN
  Administered 2012-08-30: 8 mg via ORAL
  Filled 2012-08-30: qty 1

## 2012-08-30 MED ORDER — MECLIZINE HCL 25 MG PO TABS: 25.0000 mg | ORAL_TABLET | Freq: Three times a day (TID) | ORAL | Status: DC | PRN

## 2012-08-30 MED ORDER — MECLIZINE HCL 12.5 MG PO TABS
50.0000 mg | ORAL_TABLET | Freq: Once | ORAL | Status: AC
  Administered 2012-08-30: 50 mg via ORAL
  Filled 2012-08-30: qty 4

## 2012-08-30 NOTE — ED Provider Notes (Signed)
History     CSN: [id number]  Arrival date & time 08/30/12  1929   First MD Initiated Contact with Patient 08/30/12 1942      Chief Complaint  Patient presents with  . Dizziness     HPI Pt was seen at 1950.   Per pt, c/o sudden onset and persistence of constant "dizziness" that began this evening PTA.  Pt describes the dizziness as "swimmy headed" and "everything is moving." Symptoms worsen with moving her head side-to-side and position changes.  Has been associated with runny/stuffy nose, sinus and ears congestion for the past several days.  Denies sore throat, no fevers, no CP/palpitations, no SOB/cough, no abd pain, no N/V/D, no head/neck injury, no visual changes, no focal motor weakness, no tingling/numbness in extremities.     Past Medical History  Diagnosis Date  . Hypertension   . Asthma     Past Surgical History  Procedure Date  . Breast surgery   . Cholecystectomy     History  Substance Use Topics  . Smoking status: Never Smoker   . Smokeless tobacco: Not on file  . Alcohol Use: No      Review of Systems ROS: Statement: All systems negative except as marked or noted in the HPI; Constitutional: Negative for fever and chills. ; ; Eyes: Negative for eye pain, redness and discharge. ; ; ENMT: Negative for ear pain, hoarseness, sore throat. +nasal congestion, sinus pressure. ; ; Cardiovascular: Negative for chest pain, palpitations, diaphoresis, dyspnea and peripheral edema. ; ; Respiratory: Negative for cough, wheezing and stridor. ; ; Gastrointestinal: Negative for nausea, vomiting, diarrhea, abdominal pain, blood in stool, hematemesis, jaundice and rectal bleeding. . ; ; Genitourinary: Negative for dysuria, flank pain and hematuria. ; ; Musculoskeletal: Negative for back pain and neck pain. Negative for swelling and trauma.; ; Skin: Negative for pruritus, rash, abrasions, blisters, bruising and skin lesion.; ; Neuro: +vertigo. Negative for headache, lightheadedness and  neck stiffness. Negative for weakness, altered level of consciousness , altered mental status, extremity weakness, paresthesias, involuntary movement, seizure and syncope.       Allergies  Review of patient's allergies indicates no known allergies.  Home Medications   Current Outpatient Rx  Name  Route  Sig  Dispense  Refill  . ALBUTEROL SULFATE HFA 108 (90 BASE) MCG/ACT IN AERS   Inhalation   Inhale 2 puffs into the lungs every 4 (four) hours as needed for wheezing.   1 Inhaler   0   . HYDROCHLOROTHIAZIDE 25 MG PO TABS   Oral   Take 25 mg by mouth daily.           Marland Kitchen LOVASTATIN 20 MG PO TABS   Oral   Take 20 mg by mouth at bedtime.           BP 121/85  Pulse 101  Temp 98.9 F (37.2 C) (Oral)  Resp 16  Ht 5\' 1"  (1.549 m)  Wt 232 lb (105.235 kg)  BMI 43.84 kg/m2  SpO2 100%  LMP 08/19/2012  Physical Exam 1955: Physical examination:  Nursing notes reviewed; Vital signs and O2 SAT reviewed;  Constitutional: Well developed, Well nourished, Well hydrated, In no acute distress; Head:  Normocephalic, atraumatic; Eyes: EOMI, PERRL, No scleral icterus; ENMT: TM's clear bilat. +edemetous nasal turbinates bilat with clear rhinorrhea. Mouth and pharynx normal, Mucous membranes moist; Neck: Supple, Full range of motion, No lymphadenopathy; Cardiovascular: Regular rate and rhythm, No murmur, rub, or gallop; Respiratory: Breath sounds clear & equal  bilaterally, No rales, rhonchi, wheezes.  Speaking full sentences with ease, Normal respiratory effort/excursion; Chest: Nontender, Movement normal; Abdomen: Soft, Nontender, Nondistended, Normal bowel sounds; Genitourinary: No CVA tenderness; Extremities: Pulses normal, No tenderness, No edema, No calf edema or asymmetry.; Neuro: AA&Ox3, Major CN grossly intact.  Strength 5/5 equal bilat UE's and LE's.  DTR 2/4 equal bilat UE's and LE's.  No gross sensory deficits.  Normal cerebellar testing bilat UE's (finger-nose) and LE's (heel-shin).  Speech clear.  No facial droop.  +left gaze horizontal gaze fatigable nystagmus which reproduces pt's symptoms;; Skin: Color normal, Warm, Dry.   ED Course  Procedures     MDM  MDM Reviewed: previous chart, nursing note and vitals Reviewed previous: MRI Interpretation: CT scan and labs   Results for orders placed during the hospital encounter of 08/30/12  URINALYSIS, ROUTINE W REFLEX MICROSCOPIC      Component Value Range   Color, Urine YELLOW  YELLOW   APPearance CLEAR  CLEAR   Specific Gravity, Urine 1.010  1.005 - 1.030   pH 6.5  5.0 - 8.0   Glucose, UA NEGATIVE  NEGATIVE mg/dL   Hgb urine dipstick TRACE (*) NEGATIVE   Bilirubin Urine NEGATIVE  NEGATIVE   Ketones, ur NEGATIVE  NEGATIVE mg/dL   Protein, ur NEGATIVE  NEGATIVE mg/dL   Urobilinogen, UA 0.2  0.0 - 1.0 mg/dL   Nitrite NEGATIVE  NEGATIVE   Leukocytes, UA NEGATIVE  NEGATIVE  PREGNANCY, URINE      Component Value Range   Preg Test, Ur NEGATIVE  NEGATIVE  URINE MICROSCOPIC-ADD ON      Component Value Range   WBC, UA 0-2  <3 WBC/hpf   RBC / HPF 0-2  <3 RBC/hpf   Bacteria, UA RARE  RARE  POCT I-STAT, CHEM 8      Component Value Range   Sodium 137  135 - 145 mEq/L   Potassium 3.9  3.5 - 5.1 mEq/L   Chloride 100  96 - 112 mEq/L   BUN 12  6 - 23 mg/dL   Creatinine, Ser 0.45  0.50 - 1.10 mg/dL   Glucose, Bld 409 (*) 70 - 99 mg/dL   Calcium, Ion 8.11  9.14 - 1.23 mmol/L   TCO2 30  0 - 100 mmol/L   Hemoglobin 12.6  12.0 - 15.0 g/dL   HCT 78.2  95.6 - 21.3 %   Ct Head Wo Contrast 08/30/2012  *RADIOLOGY REPORT*  Clinical Data: Severe headache and dizziness.  CT HEAD WITHOUT CONTRAST  Technique:  Contiguous axial images were obtained from the base of the skull through the vertex without contrast.  Comparison: 07/01/2005 CT  Findings: No acute intracranial abnormalities are identified, including mass lesion or mass effect, hydrocephalus, extra-axial fluid collection, midline shift, hemorrhage, or acute infarction.   The visualized bony calvarium is unremarkable.  IMPRESSION: Unremarkable noncontrast head CT   Original Report Authenticated By: Harmon Pier, M.D.       2215:  Pt feels better after meds and wants to go home now. Not orthostatic.  Ambulated in the ED with steady gait.  Will tx symptomatically at this time. Dx and testing d/w pt and family.  Questions answered.  Verb understanding, agreeable to d/c home with outpt f/u.        Laray Anger, DO 09/02/12 1905

## 2012-08-30 NOTE — ED Notes (Signed)
Pt states she started feeling dizzy this evening while fixing dinner, denies pain or nausea

## 2013-09-20 ENCOUNTER — Encounter (HOSPITAL_COMMUNITY): Payer: Self-pay | Admitting: Emergency Medicine

## 2013-09-20 ENCOUNTER — Emergency Department (HOSPITAL_COMMUNITY)
Admission: EM | Admit: 2013-09-20 | Discharge: 2013-09-20 | Disposition: A | Discharge status: Home / Self Care | Payer: Medicaid Other | Attending: Emergency Medicine | Admitting: Emergency Medicine

## 2013-09-20 DIAGNOSIS — R51 Headache: Secondary | ICD-10-CM | POA: Insufficient documentation

## 2013-09-20 DIAGNOSIS — J45909 Unspecified asthma, uncomplicated: Secondary | ICD-10-CM | POA: Insufficient documentation

## 2013-09-20 DIAGNOSIS — I10 Essential (primary) hypertension: Secondary | ICD-10-CM | POA: Insufficient documentation

## 2013-09-20 DIAGNOSIS — R42 Dizziness and giddiness: Secondary | ICD-10-CM | POA: Insufficient documentation

## 2013-09-20 DIAGNOSIS — R519 Headache, unspecified: Secondary | ICD-10-CM

## 2013-09-20 DIAGNOSIS — E669 Obesity, unspecified: Secondary | ICD-10-CM | POA: Insufficient documentation

## 2013-09-20 DIAGNOSIS — Z79899 Other long term (current) drug therapy: Secondary | ICD-10-CM | POA: Insufficient documentation

## 2013-09-20 LAB — BASIC METABOLIC PANEL
BUN: 9 mg/dL (ref 6–23)
CHLORIDE: 99 meq/L (ref 96–112)
CO2: 28 meq/L (ref 19–32)
CREATININE: 0.77 mg/dL (ref 0.50–1.10)
Calcium: 9.2 mg/dL (ref 8.4–10.5)
GFR calc Af Amer: >90 mL/min (ref >90)
GFR calc non Af Amer: >90 mL/min (ref >90)
GLUCOSE: 120 mg/dL — AB (ref 70–99)
Potassium: 3.9 mEq/L (ref 3.7–5.3)
Sodium: 139 mEq/L (ref 137–147)

## 2013-09-20 MED ORDER — KETOROLAC TROMETHAMINE 30 MG/ML IJ SOLN
30.0000 mg | Freq: Once | INTRAMUSCULAR | Status: AC
  Administered 2013-09-20: 30 mg via INTRAMUSCULAR
  Filled 2013-09-20: qty 1

## 2013-09-20 MED ORDER — ACETAMINOPHEN 500 MG PO TABS
1000.0000 mg | ORAL_TABLET | Freq: Once | ORAL | Status: AC
  Administered 2013-09-20: 1000 mg via ORAL
  Filled 2013-09-20: qty 2

## 2013-09-20 MED ORDER — HYDROCHLOROTHIAZIDE 25 MG PO TABS: 25.0000 mg | ORAL_TABLET | Freq: Every day | ORAL | Status: DC

## 2013-09-20 MED ORDER — MECLIZINE HCL 12.5 MG PO TABS
25.0000 mg | ORAL_TABLET | Freq: Once | ORAL | Status: AC
  Administered 2013-09-20: 25 mg via ORAL
  Filled 2013-09-20: qty 2

## 2013-09-20 NOTE — ED Provider Notes (Signed)
CSN: [id number]     Arrival date & time 09/20/13  1053 History  This chart was scribed for Allison Hacker, MD by Adriana Reams, ED Scribe. This patient was seen in room APA19/[facility]19 and the patient's care was started at 1116.    First MD Initiated Contact with Patient 09/20/13 1116     Chief Complaint  Patient presents with  . Hypertension      The history is provided by the patient. No language interpreter was used.   HPI Comments: AMEL KITCH is a 48 y.o. female who presents to the Emergency Department complaining of HTN. She has been out of her prescription of hydrochlorothiazide 25 mg for 2 weeks. She reports associated HA (onset 3 days, rated 4/10) and feelings of mild dizziness. Denies room spinning dizziness.  She denies weakness, numbness, tingling, fever, chills, changes in balance or any other symptoms. She has not taken any medication for her symptoms. She has not been exposed to sick individuals. She denies a hx of migraines or tension HA.   Past Medical History  Diagnosis Date  . Hypertension   . Asthma    Past Surgical History  Procedure Laterality Date  . Breast surgery    . Cholecystectomy     History reviewed. No pertinent family history. History  Substance Use Topics  . Smoking status: Never Smoker   . Smokeless tobacco: Not on file  . Alcohol Use: No   OB History   Grav Para Term Preterm Abortions TAB SAB Ect Mult Living                 Review of Systems  Constitutional: Negative for fever.  Respiratory: Negative for cough, chest tightness and shortness of breath.   Cardiovascular: Negative for chest pain.  Gastrointestinal: Negative for nausea, vomiting and abdominal pain.  Genitourinary: Negative for dysuria.  Musculoskeletal: Negative for back pain and neck pain.  Skin: Negative for wound.  Neurological: Positive for dizziness and headaches. Negative for syncope, weakness and numbness.  Psychiatric/Behavioral: Negative for confusion.  All  other systems reviewed and are negative.      Allergies  Review of patient's allergies indicates no known allergies.  Home Medications   Current Outpatient Rx  Name  Route  Sig  Dispense  Refill  . albuterol (PROVENTIL HFA;VENTOLIN HFA) 108 (90 BASE) MCG/ACT inhaler   Inhalation   Inhale 2 puffs into the lungs every 4 (four) hours as needed for wheezing.   1 Inhaler   0   . hydrochlorothiazide 25 MG tablet   Oral   Take 25 mg by mouth daily.           Marland Kitchen lovastatin (MEVACOR) 20 MG tablet   Oral   Take 20 mg by mouth at bedtime.         . hydrochlorothiazide (HYDRODIURIL) 25 MG tablet   Oral   Take 1 tablet (25 mg total) by mouth daily.   15 tablet   0    BP 118/89  Pulse 105  Temp(Src) 97.8 F (36.6 C) (Oral)  Resp 20  SpO2 100%  Physical Exam  Nursing note and vitals reviewed. Constitutional: She is oriented to person, place, and time. She appears well-developed and well-nourished. No distress.  obese  HENT:  Head: Normocephalic and atraumatic.  Mouth/Throat: Oropharynx is clear and moist.  Eyes: EOM are normal. Pupils are equal, round, and reactive to light.  Neck: Neck supple.  Cardiovascular: Normal rate, regular rhythm and normal heart sounds.  No murmur heard. Pulmonary/Chest: Effort normal and breath sounds normal. No respiratory distress. She has no wheezes.  Abdominal: Soft. Bowel sounds are normal. There is no tenderness.  Neurological: She is alert and oriented to person, place, and time.  CN 2-12 intact.  5/5 strength in all 4 extremities.  No dysmetria to fnf  Skin: Skin is warm and dry.  Psychiatric: She has a normal mood and affect.    ED Course  Procedures (including critical care time) DIAGNOSTIC STUDIES: Oxygen Saturation is 100% on RA, normal by my interpretation.    COORDINATION OF CARE: 11:17 AM Discussed treatment plan which includes 1000 mg Tylenol and basic metabolic panel with pt at bedside and pt agreed to plan.     Labs Review Labs Reviewed  BASIC METABOLIC PANEL - Abnormal; Notable for the following:    Glucose, Bld 120 (*)    All other components within normal limits   Imaging Review No results found.  EKG Interpretation   None      Medications  acetaminophen (TYLENOL) tablet 1,000 mg (1,000 mg Oral Given 09/20/13 1155)  meclizine (ANTIVERT) tablet 25 mg (25 mg Oral Given 09/20/13 1155)  ketorolac (TORADOL) 30 MG/ML injection 30 mg (30 mg Intramuscular Given 09/20/13 1307)     MDM   Final diagnoses:  Headache    Patient presents with HA.  BP wnl.  Nonfocal on exam.  Patient given toradol, antivert and tylenol with improvement of symptoms.  Denies worst HA of life and no meningismus.  BMP wnl.  Will give Rx for HCTZ.  Patient to f/u with PCP.  After history, exam, and medical workup I feel the patient has been appropriately medically screened and is safe for discharge home. Pertinent diagnoses were discussed with the patient. Patient was given return precautions.  I personally performed the services described in this documentation, which was scribed in my presence. The recorded information has been reviewed and is accurate.    Allison Hacker, MD 09/20/13 1946

## 2013-09-20 NOTE — Discharge Instructions (Signed)
Migraine Headache A migraine headache is an intense, throbbing pain on one or both sides of your head. A migraine can last for 30 minutes to several hours. CAUSES  The exact cause of a migraine headache is not always known. However, a migraine may be caused when nerves in the brain become irritated and release chemicals that cause inflammation. This causes pain. Certain things may also trigger migraines, such as:  Alcohol.  Smoking.  Stress.  Menstruation.  Aged cheeses.  Foods or drinks that contain nitrates, glutamate, aspartame, or tyramine.  Lack of sleep.  Chocolate.  Caffeine.  Hunger.  Physical exertion.  Fatigue.  Medicines used to treat chest pain (nitroglycerine), birth control pills, estrogen, and some blood pressure medicines. SIGNS AND SYMPTOMS  Pain on one or both sides of your head.  Pulsating or throbbing pain.  Severe pain that prevents daily activities.  Pain that is aggravated by any physical activity.  Nausea, vomiting, or both.  Dizziness.  Pain with exposure to bright lights, loud noises, or activity.  General sensitivity to bright lights, loud noises, or smells. Before you get a migraine, you may get warning signs that a migraine is coming (aura). An aura may include:  Seeing flashing lights.  Seeing bright spots, halos, or zig-zag lines.  Having tunnel vision or blurred vision.  Having feelings of numbness or tingling.  Having trouble talking.  Having muscle weakness. DIAGNOSIS  A migraine headache is often diagnosed based on:  Symptoms.  Physical exam.  A CT scan or MRI of your head. These imaging tests cannot diagnose migraines, but they can help rule out other causes of headaches. TREATMENT Medicines may be given for pain and nausea. Medicines can also be given to help prevent recurrent migraines.  HOME CARE INSTRUCTIONS  Only take over-the-counter or prescription medicines for pain or discomfort as directed by your  health care provider. The use of long-term narcotics is not recommended.  Lie down in a dark, quiet room when you have a migraine.  Keep a journal to find out what may trigger your migraine headaches. For example, write down:  What you eat and drink.  How much sleep you get.  Any change to your diet or medicines.  Limit alcohol consumption.  Quit smoking if you smoke.  Get 7 9 hours of sleep, or as recommended by your health care provider.  Limit stress.  Keep lights dim if bright lights bother you and make your migraines worse. SEEK IMMEDIATE MEDICAL CARE IF:   Your migraine becomes severe.  You have a fever.  You have a stiff neck.  You have vision loss.  You have muscular weakness or loss of muscle control.  You start losing your balance or have trouble walking.  You feel faint or pass out.  You have severe symptoms that are different from your first symptoms. MAKE SURE YOU:   Understand these instructions.  Will watch your condition.  Will get help right away if you are not doing well or get worse. Document Released: 07/18/2005 Document Revised: 05/08/2013 Document Reviewed: 03/25/2013 [employer] Patient Information 2014 [employer].  

## 2013-09-20 NOTE — ED Notes (Signed)
Out of bp meds x 1 week. C/o headache. Dizziness " a little". Nad.

## 2014-04-16 ENCOUNTER — Encounter (HOSPITAL_COMMUNITY): Payer: Self-pay | Admitting: Emergency Medicine

## 2014-04-16 ENCOUNTER — Emergency Department (HOSPITAL_COMMUNITY)
Admission: EM | Admit: 2014-04-16 | Discharge: 2014-04-16 | Disposition: A | Discharge status: Home / Self Care | Payer: Medicaid Other | Attending: Emergency Medicine | Admitting: Emergency Medicine

## 2014-04-16 DIAGNOSIS — J45909 Unspecified asthma, uncomplicated: Secondary | ICD-10-CM | POA: Insufficient documentation

## 2014-04-16 DIAGNOSIS — J3489 Other specified disorders of nose and nasal sinuses: Secondary | ICD-10-CM | POA: Diagnosis present

## 2014-04-16 DIAGNOSIS — J069 Acute upper respiratory infection, unspecified: Secondary | ICD-10-CM | POA: Diagnosis not present

## 2014-04-16 DIAGNOSIS — M2550 Pain in unspecified joint: Secondary | ICD-10-CM | POA: Diagnosis not present

## 2014-04-16 DIAGNOSIS — IMO0001 Reserved for inherently not codable concepts without codable children: Secondary | ICD-10-CM | POA: Insufficient documentation

## 2014-04-16 DIAGNOSIS — Z79899 Other long term (current) drug therapy: Secondary | ICD-10-CM | POA: Insufficient documentation

## 2014-04-16 DIAGNOSIS — IMO0002 Reserved for concepts with insufficient information to code with codable children: Secondary | ICD-10-CM | POA: Insufficient documentation

## 2014-04-16 DIAGNOSIS — I10 Essential (primary) hypertension: Secondary | ICD-10-CM | POA: Diagnosis not present

## 2014-04-16 DIAGNOSIS — R0982 Postnasal drip: Secondary | ICD-10-CM | POA: Insufficient documentation

## 2014-04-16 MED ORDER — MUCINEX 600 MG PO TB12: 600.0000 mg | ORAL_TABLET | Freq: Two times a day (BID) | ORAL | Status: DC

## 2014-04-16 MED ORDER — FLUTICASONE PROPIONATE 50 MCG/ACT NA SUSP: 2.0000 | Freq: Every day | NASAL | Status: DC

## 2014-04-16 NOTE — Discharge Instructions (Signed)
Use nasal spray as prescribed along with taking mucinex. Continue over-the-counter cough medication. Rest and stay well hydrated.  Upper Respiratory Infection, Adult An upper respiratory infection (URI) is also sometimes known as the common cold. The upper respiratory tract includes the nose, sinuses, throat, trachea, and bronchi. Bronchi are the airways leading to the lungs. Most people improve within 1 week, but symptoms can last up to 2 weeks. A residual cough may last even longer.  CAUSES Many different viruses can infect the tissues lining the upper respiratory tract. The tissues become irritated and inflamed and often become very moist. Mucus production is also common. A cold is contagious. You can easily spread the virus to others by oral contact. This includes kissing, sharing a glass, coughing, or sneezing. Touching your mouth or nose and then touching a surface, which is then touched by another person, can also spread the virus. SYMPTOMS  Symptoms typically develop 1 to 3 days after you come in contact with a cold virus. Symptoms vary from person to person. They may include:  Runny nose.  Sneezing.  Nasal congestion.  Sinus irritation.  Sore throat.  Loss of voice (laryngitis).  Cough.  Fatigue.  Muscle aches.  Loss of appetite.  Headache.  Low-grade fever. DIAGNOSIS  You might diagnose your own cold based on familiar symptoms, since most people get a cold 2 to 3 times a year. Your caregiver can confirm this based on your exam. Most importantly, your caregiver can check that your symptoms are not due to another disease such as strep throat, sinusitis, pneumonia, asthma, or epiglottitis. Blood tests, throat tests, and X-rays are not necessary to diagnose a common cold, but they may sometimes be helpful in excluding other more serious diseases. Your caregiver will decide if any further tests are required. RISKS AND COMPLICATIONS  You may be at risk for a more severe case of  the common cold if you smoke cigarettes, have chronic heart disease (such as heart failure) or lung disease (such as asthma), or if you have a weakened immune system. The very young and very old are also at risk for more serious infections. Bacterial sinusitis, middle ear infections, and bacterial pneumonia can complicate the common cold. The common cold can worsen asthma and chronic obstructive pulmonary disease (COPD). Sometimes, these complications can require emergency medical care and may be life-threatening. PREVENTION  The best way to protect against getting a cold is to practice good hygiene. Avoid oral or hand contact with people with cold symptoms. Wash your hands often if contact occurs. There is no clear evidence that vitamin C, vitamin E, echinacea, or exercise reduces the chance of developing a cold. However, it is always recommended to get plenty of rest and practice good nutrition. TREATMENT  Treatment is directed at relieving symptoms. There is no cure. Antibiotics are not effective, because the infection is caused by a virus, not by bacteria. Treatment may include:  Increased fluid intake. Sports drinks offer valuable electrolytes, sugars, and fluids.  Breathing heated mist or steam (vaporizer or shower).  Eating chicken soup or other clear broths, and maintaining good nutrition.  Getting plenty of rest.  Using gargles or lozenges for comfort.  Controlling fevers with ibuprofen or acetaminophen as directed by your caregiver.  Increasing usage of your inhaler if you have asthma. Zinc gel and zinc lozenges, taken in the first 24 hours of the common cold, can shorten the duration and lessen the severity of symptoms. Pain medicines may help with fever, muscle  aches, and throat pain. A variety of non-prescription medicines are available to treat congestion and runny nose. Your caregiver can make recommendations and may suggest nasal or lung inhalers for other symptoms.  HOME CARE  INSTRUCTIONS   Only take over-the-counter or prescription medicines for pain, discomfort, or fever as directed by your caregiver.  Use a warm mist humidifier or inhale steam from a shower to increase air moisture. This may keep secretions moist and make it easier to breathe.  Drink enough water and fluids to keep your urine clear or pale yellow.  Rest as needed.  Return to work when your temperature has returned to normal or as your caregiver advises. You may need to stay home longer to avoid infecting others. You can also use a face mask and careful hand washing to prevent spread of the virus. SEEK MEDICAL CARE IF:   After the first few days, you feel you are getting worse rather than better.  You need your caregiver's advice about medicines to control symptoms.  You develop chills, worsening shortness of breath, or brown or red sputum. These may be signs of pneumonia.  You develop yellow or brown nasal discharge or pain in the face, especially when you bend forward. These may be signs of sinusitis.  You develop a fever, swollen neck glands, pain with swallowing, or white areas in the back of your throat. These may be signs of strep throat. SEEK IMMEDIATE MEDICAL CARE IF:   You have a fever.  You develop severe or persistent headache, ear pain, sinus pain, or chest pain.  You develop wheezing, a prolonged cough, cough up blood, or have a change in your usual mucus (if you have chronic lung disease).  You develop sore muscles or a stiff neck. Document Released: 01/11/2001 Document Revised: 10/10/2011 Document Reviewed: 10/23/2013 [facility] Patient Information 2015 [place], [place]. This information is not intended to replace advice given to you by your health care provider. Make sure you discuss any questions you have with your health care provider.

## 2014-04-16 NOTE — ED Notes (Signed)
Pt reports generalized body aches, fever, cough x 3 days.

## 2014-04-16 NOTE — ED Notes (Signed)
Flu like sx with body aches, sinus congestion, cough and sinus pressure x 2 days.

## 2014-04-16 NOTE — ED Provider Notes (Signed)
CSN: [id number]     Arrival date & time 04/16/14  1705 History   First MD Initiated Contact with Patient 04/16/14 1802     Chief Complaint  Patient presents with  . flu like sx      (Consider location/radiation/quality/duration/timing/severity/associated sxs/prior Treatment) HPI Comments: Patient is a 48 year old female with a past medical history of hypertension and asthma who presents to the emergency department complaining of nasal congestion, sinus pressure and cough x3-4 days. Cough is productive with clear and yellow mucus. States she is developing body aches and slight nausea. She can feel mucus dripping in the back of her throat. She has tried taking over-the-counter cough medication with temporary relief of her cough. Admits to subjective fever and chills. She took Tylenol earlier today which made her chills go away. Denies any sick contacts.  The history is provided by the patient.    Past Medical History  Diagnosis Date  . Hypertension   . Asthma    Past Surgical History  Procedure Laterality Date  . Breast surgery    . Cholecystectomy     No family history on file. History  Substance Use Topics  . Smoking status: Never Smoker   . Smokeless tobacco: Not on file  . Alcohol Use: No   OB History   Grav Para Term Preterm Abortions TAB SAB Ect Mult Living                 Review of Systems  Constitutional: Positive for fever and chills.  HENT: Positive for congestion, postnasal drip and sinus pressure.   Respiratory: Positive for cough.   Musculoskeletal: Positive for arthralgias and myalgias.  All other systems reviewed and are negative.     Allergies  Review of patient's allergies indicates no known allergies.  Home Medications   Prior to Admission medications   Medication Sig Start Date End Date Taking? Authorizing Provider  albuterol (PROVENTIL HFA;VENTOLIN HFA) 108 (90 BASE) MCG/ACT inhaler Inhale 2 puffs into the lungs every 4 (four) hours as needed  for wheezing. 03/01/12 09/20/13  Lenox Ahr, PA-C  fluticasone (FLONASE) 50 MCG/ACT nasal spray Place 2 sprays into both nostrils daily. 04/16/14   Illene Labrador, PA-C  hydrochlorothiazide (HYDRODIURIL) 25 MG tablet Take 1 tablet (25 mg total) by mouth daily. 09/20/13   Merryl Hacker, MD  hydrochlorothiazide 25 MG tablet Take 25 mg by mouth daily.      Historical Provider, MD  lovastatin (MEVACOR) 20 MG tablet Take 20 mg by mouth at bedtime.    Historical Provider, MD  MUCINEX 600 MG 12 hr tablet Take 1 tablet (600 mg total) by mouth 2 (two) times daily. 04/16/14   Illene Labrador, PA-C   BP 131/87  Pulse 93  Temp(Src) 98.6 F (37 C) (Oral)  Resp 16  Ht 5\' 1"  (1.549 m)  Wt 223 lb (101.152 kg)  BMI 42.16 kg/m2  SpO2 99%  LMP 04/09/2014 Physical Exam  Nursing note and vitals reviewed. Constitutional: She is oriented to person, place, and time. She appears well-developed and well-nourished. No distress.  HENT:  Head: Normocephalic and atraumatic.  Nasal mucosal edema. Post nasal drip. No post-oropharyngeal erythema, edema or exudate. No frontal or maxillary sinus tenderness.  Eyes: Conjunctivae are normal.  Neck: Normal range of motion. Neck supple.  Cardiovascular: Normal rate, regular rhythm and normal heart sounds.   Pulmonary/Chest: Effort normal and breath sounds normal. No respiratory distress. She has no wheezes. She has no rales.  Musculoskeletal: Normal  range of motion. She exhibits no edema.  Lymphadenopathy:    She has no cervical adenopathy.  Neurological: She is alert and oriented to person, place, and time.  Skin: Skin is warm and dry. She is not diaphoretic.  Psychiatric: She has a normal mood and affect. Her behavior is normal.    ED Course  Procedures (including critical care time) Labs Review Labs Reviewed - No data to display  Imaging Review No results found.   EKG Interpretation None      MDM   Final diagnoses:  URI (upper respiratory  infection)   Patient well-appearing in no apparent distress. Afebrile, vital signs stable. Lungs clear. Symptoms present for 3-4 days. I do not feel antibiotics are appropriate at this time. She was Mucinex, Flonase, saltwater gargles. Stable for discharge. Return precautions given. Patient states understanding of treatment care plan and is agreeable.  Illene Labrador, PA-C 04/16/14 [phone]

## 2014-04-17 NOTE — ED Provider Notes (Signed)
Medical screening examination/treatment/procedure(s) were performed by non-physician practitioner and as supervising physician I was immediately available for consultation/collaboration.   EKG Interpretation None        Fredia Sorrow, MD 04/17/14 [phone]

## 2014-05-07 ENCOUNTER — Other Ambulatory Visit (HOSPITAL_COMMUNITY): Payer: Self-pay | Admitting: Internal Medicine

## 2014-05-07 DIAGNOSIS — Z1231 Encounter for screening mammogram for malignant neoplasm of breast: Secondary | ICD-10-CM

## 2014-05-14 ENCOUNTER — Ambulatory Visit (HOSPITAL_COMMUNITY)
Admission: RE | Admit: 2014-05-14 | Discharge: 2014-05-14 | Disposition: A | Discharge status: Home / Self Care | Payer: Medicaid Other | Source: Ambulatory Visit | Attending: Internal Medicine | Admitting: Internal Medicine

## 2014-05-14 DIAGNOSIS — Z1231 Encounter for screening mammogram for malignant neoplasm of breast: Secondary | ICD-10-CM | POA: Insufficient documentation

## 2014-05-28 ENCOUNTER — Telehealth: Payer: Self-pay

## 2014-05-28 NOTE — Telephone Encounter (Signed)
Pt left VM to call to schedule her colonoscopy. Was referred by Dr. Legrand Rams.

## 2014-05-28 NOTE — Telephone Encounter (Signed)
Gastroenterology Pre-Procedure Review  Request Date: Requesting Physician: Dr.Fanta  PATIENT REVIEW QUESTIONS: The patient responded to the following health history questions as indicated:    1. Diabetes Melitis: NO 2. Joint replacements in the past 12 months: NO 3. Major health problems in the past 3 months: NO 4. Has an artificial valve or MVP: NO 5. Has a defibrillator: NO 6. Has been advised in past to take antibiotics in advance of a procedure like teeth cleaning: NO 7. Acholic:NO  8.Family History Colon Cancer: NO    MEDICATIONS & ALLERGIES:    Patient reports the following regarding taking any blood thinners:   Plavix? NO Aspirin? YES- BABY ASA Coumadin? NO  Patient confirms/reports the following medications:  Current Outpatient Prescriptions  Medication Sig Dispense Refill  . albuterol (PROVENTIL HFA;VENTOLIN HFA) 108 (90 BASE) MCG/ACT inhaler Inhale 2 puffs into the lungs every 4 (four) hours as needed for wheezing.  1 Inhaler  0  . fluticasone (FLONASE) 50 MCG/ACT nasal spray Place 2 sprays into both nostrils daily.  16 g  0  . hydrochlorothiazide (HYDRODIURIL) 25 MG tablet Take 1 tablet (25 mg total) by mouth daily.  15 tablet  0  . hydrochlorothiazide 25 MG tablet Take 25 mg by mouth daily.        Marland Kitchen lovastatin (MEVACOR) 20 MG tablet Take 20 mg by mouth at bedtime.      . MUCINEX 600 MG 12 hr tablet Take 1 tablet (600 mg total) by mouth 2 (two) times daily.  12 tablet  0   No current facility-administered medications for this visit.    Patient confirms/reports the following allergies:  No Known Allergies  No orders of the defined types were placed in this encounter.    AUTHORIZATION INFORMATION Primary Insurance: Medicaid   ID #:[id number]   Group #:  Pre-Cert / Auth required:  Pre-Cert / Auth #:    SCHEDULE INFORMATION: Procedure has been scheduled as follows:  Date: , Time: Location:   This Gastroenterology Pre-Precedure Review Form is being routed to  the following provider(s):    Would like Dr.Feilds

## 2014-06-11 ENCOUNTER — Other Ambulatory Visit: Payer: Self-pay

## 2014-06-11 DIAGNOSIS — Z1211 Encounter for screening for malignant neoplasm of colon: Secondary | ICD-10-CM

## 2014-06-11 NOTE — Telephone Encounter (Signed)
Pt is scheduled with Dr. Oneida Alar for colonoscopy on 07/15/2014 9:30 AM.

## 2014-06-17 ENCOUNTER — Encounter (HOSPITAL_COMMUNITY): Payer: Self-pay | Admitting: Emergency Medicine

## 2014-06-17 ENCOUNTER — Emergency Department (HOSPITAL_COMMUNITY)
Admission: EM | Admit: 2014-06-17 | Discharge: 2014-06-17 | Disposition: A | Discharge status: Home / Self Care | Payer: MEDICAID | Attending: Emergency Medicine | Admitting: Emergency Medicine

## 2014-06-17 DIAGNOSIS — Z7951 Long term (current) use of inhaled steroids: Secondary | ICD-10-CM | POA: Diagnosis not present

## 2014-06-17 DIAGNOSIS — J45909 Unspecified asthma, uncomplicated: Secondary | ICD-10-CM | POA: Diagnosis not present

## 2014-06-17 DIAGNOSIS — I10 Essential (primary) hypertension: Secondary | ICD-10-CM | POA: Diagnosis not present

## 2014-06-17 DIAGNOSIS — Z79899 Other long term (current) drug therapy: Secondary | ICD-10-CM | POA: Diagnosis not present

## 2014-06-17 DIAGNOSIS — G44209 Tension-type headache, unspecified, not intractable: Secondary | ICD-10-CM

## 2014-06-17 DIAGNOSIS — R51 Headache: Secondary | ICD-10-CM | POA: Diagnosis present

## 2014-06-17 MED ORDER — METOCLOPRAMIDE HCL 5 MG/ML IJ SOLN
10.0000 mg | Freq: Once | INTRAMUSCULAR | Status: AC
  Administered 2014-06-17: 10 mg via INTRAVENOUS
  Filled 2014-06-17: qty 2

## 2014-06-17 MED ORDER — DIPHENHYDRAMINE HCL 50 MG/ML IJ SOLN
12.5000 mg | Freq: Once | INTRAMUSCULAR | Status: AC
  Administered 2014-06-17: 12.5 mg via INTRAVENOUS
  Filled 2014-06-17: qty 1

## 2014-06-17 MED ORDER — TRAMADOL HCL 50 MG PO TABS: 50.0000 mg | ORAL_TABLET | Freq: Four times a day (QID) | ORAL | Status: DC | PRN

## 2014-06-17 MED ORDER — KETOROLAC TROMETHAMINE 30 MG/ML IJ SOLN
30.0000 mg | Freq: Once | INTRAMUSCULAR | Status: AC
  Administered 2014-06-17: 30 mg via INTRAVENOUS
  Filled 2014-06-17: qty 1

## 2014-06-17 NOTE — ED Notes (Signed)
PT reports headache on the left side x1 day with nausea. PT denies any injury. PT reports taking an aspirin and her HTN meds today.

## 2014-06-17 NOTE — ED Provider Notes (Signed)
CSN: [id number]     Arrival date & time 06/17/14  1628 History  First MD Initiated Contact with Patient 06/17/14 1922     Chief Complaint  Patient presents with  . Headache   HPI Patient presents to the emergency room with complaints of persistent left-sided headache for the last 1 day. The headache is located on the left side of her head and face. It moves down towards her left neck and shoulder. Movement and palpation makes it worse. She has not noticed any issues with photophobia. She has not had any fevers. She denies any nausea or vomiting. No numbness or weakness.Patient tried taking her daily 81 mg aspirin but no other medications for pain. Past Medical History  Diagnosis Date  . Hypertension   . Asthma    Past Surgical History  Procedure Laterality Date  . Breast surgery    . Cholecystectomy     No family history on file. History  Substance Use Topics  . Smoking status: Never Smoker   . Smokeless tobacco: Not on file  . Alcohol Use: No   OB History    No data available     Review of Systems  All other systems reviewed and are negative.     Allergies  Review of patient's allergies indicates no known allergies.  Home Medications   Prior to Admission medications   Medication Sig Start Date End Date Taking? Authorizing Provider  albuterol (PROVENTIL HFA;VENTOLIN HFA) 108 (90 BASE) MCG/ACT inhaler Inhale 2 puffs into the lungs every 4 (four) hours as needed for wheezing.    Lenox Ahr, PA-C  fluticasone (FLONASE) 50 MCG/ACT nasal spray Place 2 sprays into both nostrils daily. 04/16/14   Robyn M Hess, PA-C  hydrochlorothiazide (HYDRODIURIL) 25 MG tablet Take 1 tablet (25 mg total) by mouth daily. 09/20/13   Merryl Hacker, MD  hydrochlorothiazide 25 MG tablet Take 25 mg by mouth daily.      Historical Provider, MD  lovastatin (MEVACOR) 20 MG tablet Take 20 mg by mouth at bedtime.    Historical Provider, MD  MUCINEX 600 MG 12 hr tablet Take 1 tablet (600 mg  total) by mouth 2 (two) times daily. 04/16/14   Robyn M Hess, PA-C  traMADol (ULTRAM) 50 MG tablet Take 1 tablet (50 mg total) by mouth every 6 (six) hours as needed. 06/17/14   Dorie Rank, MD   BP 147/110 mmHg  Pulse 90  Temp(Src) 98.2 F (36.8 C) (Oral)  Resp 18  Ht 5\' 1"  (1.549 m)  Wt 229 lb (103.874 kg)  BMI 43.29 kg/m2  SpO2 96%  LMP 06/10/2014 Physical Exam  Constitutional: She appears well-developed and well-nourished. No distress.  HENT:  Head: Normocephalic and atraumatic.  Right Ear: Tympanic membrane and external ear normal.  Left Ear: Tympanic membrane and external ear normal.  Eyes: Conjunctivae are normal. Right eye exhibits no discharge. Left eye exhibits no discharge. No scleral icterus.  Neck: Normal range of motion. Neck supple. No rigidity. No tracheal deviation and normal range of motion present.  Cardiovascular: Normal rate, regular rhythm and intact distal pulses.   Pulmonary/Chest: Effort normal and breath sounds normal. No stridor. No respiratory distress. She has no wheezes. She has no rales.  Abdominal: Soft. Bowel sounds are normal. She exhibits no distension. There is no tenderness. There is no rebound and no guarding.  Musculoskeletal: She exhibits no edema or tenderness.  Tenderness to palpation left trapezius muscle no tenderness in the right  Neurological: She  is alert. She has normal strength. No cranial nerve deficit (no facial droop, extraocular movements intact, no slurred speech) or sensory deficit. She exhibits normal muscle tone. She displays no seizure activity. Coordination normal.  Skin: Skin is warm and dry. No rash noted.  Psychiatric: She has a normal mood and affect.  Nursing note and vitals reviewed.   ED Course  Procedures (including critical care time) Medications  ketorolac (TORADOL) 30 MG/ML injection 30 mg (30 mg Intravenous Given 06/17/14 1947)  metoCLOPramide (REGLAN) injection 10 mg (10 mg Intravenous Given 06/17/14 1946)   diphenhydrAMINE (BENADRYL) injection 12.5 mg (12.5 mg Intravenous Given 06/17/14 1948)     MDM   Final diagnoses:  Tension headache    Symptoms consistent with tension headache.  Improved with treatment in the ED.  Doubt meningitis, SAH, cerebral hemorrhage or other acute emergency medical condition.  Follow up with PCP.  Warning signs and precautions discussed    Dorie Rank, MD 06/17/14 2025

## 2014-06-17 NOTE — Discharge Instructions (Signed)

## 2014-06-18 NOTE — Telephone Encounter (Signed)
MOVI PREP SPLIT DOSING, REGULAR BREAKFAST. CLEAR LIQUIDS AFTER 9 AM.  

## 2014-06-20 MED ORDER — PEG-KCL-NACL-NASULF-NA ASC-C 100 G PO SOLR: 1.0000 | ORAL | Status: DC

## 2014-06-20 NOTE — Telephone Encounter (Signed)
Rx sent to the pharmacy and instructions mailed to pt.  

## 2014-06-30 ENCOUNTER — Other Ambulatory Visit: Payer: Self-pay

## 2014-07-09 ENCOUNTER — Telehealth: Payer: Self-pay

## 2014-07-09 NOTE — Telephone Encounter (Signed)
Called pt to update triage. She said there has been no change in her medications since she was triaged.

## 2014-07-15 ENCOUNTER — Ambulatory Visit (HOSPITAL_COMMUNITY)
Admission: RE | Admit: 2014-07-15 | Discharge: 2014-07-15 | Disposition: A | Discharge status: Home / Self Care | Payer: Medicaid Other | Source: Ambulatory Visit | Attending: Gastroenterology | Admitting: Gastroenterology

## 2014-07-15 ENCOUNTER — Encounter (HOSPITAL_COMMUNITY): Payer: Self-pay | Admitting: *Deleted

## 2014-07-15 ENCOUNTER — Encounter (HOSPITAL_COMMUNITY)
Admission: RE | Disposition: A | Discharge status: Home / Self Care | Payer: Self-pay | Source: Ambulatory Visit | Attending: Gastroenterology

## 2014-07-15 DIAGNOSIS — K644 Residual hemorrhoidal skin tags: Secondary | ICD-10-CM | POA: Insufficient documentation

## 2014-07-15 DIAGNOSIS — I10 Essential (primary) hypertension: Secondary | ICD-10-CM | POA: Insufficient documentation

## 2014-07-15 DIAGNOSIS — D122 Benign neoplasm of ascending colon: Secondary | ICD-10-CM | POA: Insufficient documentation

## 2014-07-15 DIAGNOSIS — D123 Benign neoplasm of transverse colon: Secondary | ICD-10-CM | POA: Diagnosis not present

## 2014-07-15 DIAGNOSIS — Z1211 Encounter for screening for malignant neoplasm of colon: Secondary | ICD-10-CM | POA: Insufficient documentation

## 2014-07-15 DIAGNOSIS — D120 Benign neoplasm of cecum: Secondary | ICD-10-CM | POA: Insufficient documentation

## 2014-07-15 DIAGNOSIS — J45909 Unspecified asthma, uncomplicated: Secondary | ICD-10-CM | POA: Diagnosis not present

## 2014-07-15 HISTORY — PX: COLONOSCOPY: SHX5424

## 2014-07-15 SURGERY — COLONOSCOPY
Anesthesia: Moderate Sedation

## 2014-07-15 MED ORDER — MIDAZOLAM HCL 5 MG/5ML IJ SOLN
INTRAMUSCULAR | Status: DC | PRN
  Administered 2014-07-15: 1 mg via INTRAVENOUS
  Administered 2014-07-15 (×3): 2 mg via INTRAVENOUS

## 2014-07-15 MED ORDER — STERILE WATER FOR IRRIGATION IR SOLN
Status: DC | PRN
  Administered 2014-07-15: 13:00:00

## 2014-07-15 MED ORDER — MEPERIDINE HCL 100 MG/ML IJ SOLN
INTRAMUSCULAR | Status: DC | PRN
  Administered 2014-07-15 (×3): 25 mg via INTRAVENOUS

## 2014-07-15 MED ORDER — SODIUM CHLORIDE 0.9 % IV SOLN
INTRAVENOUS | Status: DC
  Administered 2014-07-15: 12:00:00 via INTRAVENOUS

## 2014-07-15 MED ORDER — MEPERIDINE HCL 100 MG/ML IJ SOLN
INTRAMUSCULAR | Status: AC
  Filled 2014-07-15: qty 2

## 2014-07-15 MED ORDER — MIDAZOLAM HCL 5 MG/5ML IJ SOLN
INTRAMUSCULAR | Status: AC
  Filled 2014-07-15: qty 10

## 2014-07-15 NOTE — H&P (Signed)
  Primary Care Physician:  Rosita Fire, MD Primary Gastroenterologist:  Dr. Oneida Alar  Pre-Procedure History & Physical: HPI:  Allison [name] is a 48 y.o. female here for [place].  Past Medical History  Diagnosis Date  . Hypertension   . Asthma     Past Surgical History  Procedure Laterality Date  . Breast surgery    . Cholecystectomy      Prior to Admission medications   Medication Sig Start Date End Date Taking? Authorizing Provider  albuterol (PROVENTIL HFA;VENTOLIN HFA) 108 (90 BASE) MCG/ACT inhaler Inhale 2 puffs into the lungs every 4 (four) hours as needed for wheezing.   Yes Lenox Ahr, PA-C  hydrochlorothiazide 25 MG tablet Take 25 mg by mouth daily.     Yes Historical Provider, MD  lovastatin (MEVACOR) 20 MG tablet Take 20 mg by mouth at bedtime.   Yes Historical Provider, MD  peg 3350 powder (MOVIPREP) 100 G SOLR Take 1 kit (200 g total) by mouth as directed. 06/20/14  Yes Danie Binder, MD  traMADol (ULTRAM) 50 MG tablet Take 1 tablet (50 mg total) by mouth every 6 (six) hours as needed. 06/17/14  Yes Dorie Rank, MD    Allergies as of 06/11/2014  . (No Known Allergies)    History reviewed. No pertinent family history.  History   Social History  . Marital Status: Single    Spouse Name: N/A    Number of Children: N/A  . Years of Education: N/A   Occupational History  . Not on file.   Social History Main Topics  . Smoking status: Never Smoker   . Smokeless tobacco: Not on file  . Alcohol Use: No  . Drug Use: No  . Sexual Activity: Not on file   Other Topics Concern  . Not on file   Social History Narrative    Review of Systems: See HPI, otherwise negative ROS   Physical Exam: BP 131/81 mmHg  Pulse 95  Temp(Src) 98 F (36.7 C) (Oral)  Resp 18  Ht _0  (1.549 m)  Wt 225 lb (102.059 kg)  BMI 42.54 kg/m2  SpO2 94% General:   Alert,  pleasant and cooperative in NAD Head:  Normocephalic and atraumatic. Neck:   Supple; Lungs:  Clear throughout to auscultation.    Heart:  Regular rate and rhythm. Abdomen:  Soft, nontender and nondistended. Normal bowel sounds, without guarding, and without rebound.   Neurologic:  Alert and  oriented x4;  grossly normal neurologically.  Impression/Plan:    SCREENING  Plan:  1. TCS TODAY

## 2014-07-15 NOTE — Discharge Instructions (Signed)
You had 3 polyps removed.    FOLLOW A HIGH FIBER DIET. AVOID ITEMS THAT CAUSE BLOATING. SEE INFO BELOW.  YOUR BIOPSY WILL BE BACK IN 14 DAYS OR YOU CAN LOOK THEM UP ON MY CHART AFTER DEC 17.  Next colonoscopy in 1-3 years. YOUR SISTERS, BROTHERS, CHILDREN, AND PARENTS NEED TO HAVE A COLONOSCOPY STARTING AT THE AGE OF 40.   Colonoscopy Care After Read the instructions outlined below and refer to this sheet in the next week. These discharge instructions provide you with general information on caring for yourself after you leave the hospital. While your treatment has been planned according to the most current medical practices available, unavoidable complications occasionally occur. If you have any problems or questions after discharge, call DR. Jodey Burbano, [phone].  ACTIVITY  You may resume your regular activity, but move at a slower pace for the next 24 hours.   Take frequent rest periods for the next 24 hours.   Walking will help get rid of the air and reduce the bloated feeling in your belly (abdomen).   No driving for 24 hours (because of the medicine (anesthesia) used during the test).   You may shower.   Do not sign any important legal documents or operate any machinery for 24 hours (because of the anesthesia used during the test).    NUTRITION  Drink plenty of fluids.   You may resume your normal diet as instructed by your doctor.   Begin with a light meal and progress to your normal diet. Heavy or fried foods are harder to digest and may make you feel sick to your stomach (nauseated).   Avoid alcoholic beverages for 24 hours or as instructed.    MEDICATIONS  You may resume your normal medications.   WHAT YOU CAN EXPECT TODAY  Some feelings of bloating in the abdomen.   Passage of more gas than usual.   Spotting of blood in your stool or on the toilet paper  .  IF YOU HAD POLYPS REMOVED DURING THE COLONOSCOPY:  Eat a soft diet IF YOU HAVE NAUSEA, BLOATING,  ABDOMINAL PAIN, OR VOMITING.    FINDING OUT THE RESULTS OF YOUR TEST Not all test results are available during your visit. DR. Oneida Alar WILL CALL YOU WITHIN 14 DAYS OF YOUR PROCEDUE WITH YOUR RESULTS. Do not assume everything is normal if you have not heard from DR. Shamira Toutant, CALL HER OFFICE AT [phone].  SEEK IMMEDIATE MEDICAL ATTENTION AND CALL THE OFFICE: [phone] IF:  You have more than a spotting of blood in your stool.   Your belly is swollen (abdominal distention).   You are nauseated or vomiting.   You have a temperature over 101F.   You have abdominal pain or discomfort that is severe or gets worse throughout the day.   High-Fiber Diet A high-fiber diet changes your normal diet to include more whole grains, legumes, fruits, and vegetables. Changes in the diet involve replacing refined carbohydrates with unrefined foods. The calorie level of the diet is essentially unchanged. The Dietary Reference Intake (recommended amount) for adult males is 38 grams per day. For adult females, it is 25 grams per day. Pregnant and lactating women should consume 28 grams of fiber per day. Fiber is the intact part of a plant that is not broken down during digestion. Functional fiber is fiber that has been isolated from the plant to provide a beneficial effect in the body. PURPOSE  Increase stool bulk.   Ease and regulate bowel movements.  Lower cholesterol.  INDICATIONS THAT YOU NEED MORE FIBER  Constipation and hemorrhoids.   Uncomplicated diverticulosis (intestine condition) and irritable bowel syndrome.   Weight management.   As a protective measure against hardening of the arteries (atherosclerosis), diabetes, and cancer.   GUIDELINES FOR INCREASING FIBER IN THE DIET  Start adding fiber to the diet slowly. A gradual increase of about 5 more grams (2 slices of whole-wheat bread, 2 servings of most fruits or vegetables, or 1 bowl of high-fiber cereal) per day is best. Too rapid  an increase in fiber may result in constipation, flatulence, and bloating.   Drink enough water and fluids to keep your urine clear or pale yellow. Water, juice, or caffeine-free drinks are recommended. Not drinking enough fluid may cause constipation.   Eat a variety of high-fiber foods rather than one type of fiber.   Try to increase your intake of fiber through using high-fiber foods rather than fiber pills or supplements that contain small amounts of fiber.   The goal is to change the types of food eaten. Do not supplement your present diet with high-fiber foods, but replace foods in your present diet.  INCLUDE A VARIETY OF FIBER SOURCES  Replace refined and processed grains with whole grains, canned fruits with fresh fruits, and incorporate other fiber sources. White rice, white breads, and most bakery goods contain little or no fiber.   Brown whole-grain rice, buckwheat oats, and many fruits and vegetables are all good sources of fiber. These include: broccoli, Brussels sprouts, cabbage, cauliflower, beets, sweet potatoes, white potatoes (skin on), carrots, tomatoes, eggplant, squash, berries, fresh fruits, and dried fruits.   Cereals appear to be the richest source of fiber. Cereal fiber is found in whole grains and bran. Bran is the fiber-rich outer coat of cereal grain, which is largely removed in refining. In whole-grain cereals, the bran remains. In breakfast cereals, the largest amount of fiber is found in those with "bran" in their names. The fiber content is sometimes indicated on the label.   You may need to include additional fruits and vegetables each day.   In baking, for 1 cup white flour, you may use the following substitutions:   1 cup whole-wheat flour minus 2 tablespoons.   1/2 cup white flour plus 1/2 cup whole-wheat flour.   Polyps, Colon  A polyp is extra tissue that grows inside your body. Colon polyps grow in the large intestine. The large intestine, also called  the colon, is part of your digestive system. It is a long, hollow tube at the end of your digestive tract where your body makes and stores stool. Most polyps are not dangerous. They are benign. This means they are not cancerous. But over time, some types of polyps can turn into cancer. Polyps that are smaller than a pea are usually not harmful. But larger polyps could someday become or may already be cancerous. To be safe, doctors remove all polyps and test them.   WHO GETS POLYPS? Anyone can get polyps, but certain people are more likely than others. You may have a greater chance of getting polyps if:  You are over 50.   You have had polyps before.   Someone in your family has had polyps.   Someone in your family has had cancer of the large intestine.   Find out if someone in your family has had polyps. You may also be more likely to get polyps if you:   Eat a lot of fatty foods  Smoke   Drink alcohol   Do not exercise  Eat too much   TREATMENT  The caregiver will remove the polyp during sigmoidoscopy or colonoscopy.    PREVENTION There is not one sure way to prevent polyps. You might be able to lower your risk of getting them if you:  Eat more fruits and vegetables and less fatty food.   Do not smoke.   Avoid alcohol.   Exercise every day.   Lose weight if you are overweight.   Eating more calcium and folate can also lower your risk of getting polyps. Some foods that are rich in calcium are milk, cheese, and broccoli. Some foods that are rich in folate are chickpeas, kidney beans, and spinach.

## 2014-07-16 NOTE — Op Note (Addendum)
[facility] [address] [place], [address]   COLONOSCOPY PROCEDURE REPORT  PATIENT: Allison, [name]  MR#: [id number] BIRTHDATE: 1965-09-05 , 48  yrs. old GENDER: female ENDOSCOPIST: Barney Drain, MD REFERRED ER:[id number] Fanta, M.D. PROCEDURE DATE:  07/15/2014 PROCEDURE:   Colonoscopy with snare polypectomy and Colonoscopy with cold biopsy polypectomy INDICATIONS:average risk for colon cancer. MEDICATIONS: Demerol 75 mg IV and Versed 7 mg IV  DESCRIPTION OF PROCEDURE:    Physical exam was performed.  Informed consent was obtained from the patient after explaining the benefits, risks, and alternatives to procedure.  The patient was connected to monitor and placed in left lateral position. Continuous oxygen was provided by nasal cannula and IV medicine administered through an indwelling cannula.  After administration of sedation and rectal exam, the patients rectum was intubated and the EC-[id number] ([id number])  colonoscope was advanced under direct visualization to the cecum.  The scope was removed slowly by carefully examining the color, texture, anatomy, and integrity mucosa on the way out.  The patient was recovered in endoscopy and discharged home in satisfactory condition.    COLON FINDINGS: Two sessile polyps ranging from 3 to 18mm in size were found in the proximal transverse colon and at the cecum.  A polypectomy was performed with cold forceps.  , A sessile polyp measuring 1.2 cm in size was found in the ascending colon.  A polypectomy was performed with cold forceps.  A polypectomy was performed using snare cautery AND POLYP RETRIEVED WITH ROTH NET. The examination was otherwise normal.  , and Small external hemorrhoids were found.  PREP QUALITY: good.  CECAL W/D TIME: 16 mins          COMPLICATIONS: None  ENDOSCOPIC IMPRESSION: 1.   THREE COLON POLYPS REMOVED 2.    Small external hemorrhoids  RECOMMENDATIONS: FOLLOW A HIGH FIBER DIET.  AVOID  ITEMS THAT CAUSE BLOATING. AWAIT BIOPSY. Next colonoscopy in 1-3 years with OSMOPREP PER PT REQUEST.  ALL SISTERS, BROTHERS, CHILDREN, AND PARENTS NEED TO HAVE A COLONOSCOPY STARTING AT THE AGE OF 40.     _______________________________ Lorrin MaisBarney Drain, MD 08-13-14 5:20 PM Revised: 08-13-2014 5:20 PM   CPT CODES: ICD CODES:  The ICD and CPT codes recommended by this software are interpretations from the data that the clinical staff has captured with the software.  The verification of the translation of this report to the ICD and CPT codes and modifiers is the sole responsibility of the health care institution and practicing physician where this report was generated.  [employer] will not be held responsible for the validity of the ICD and CPT codes included on this report.  AMA assumes no liability for data contained or not contained herein. CPT is a Designer, television/film set of the [employer].

## 2014-07-16 NOTE — Telephone Encounter (Signed)
REVIEWED-NO ADDITIONAL RECOMMENDATIONS. 

## 2014-07-22 ENCOUNTER — Encounter (HOSPITAL_COMMUNITY): Payer: Self-pay | Admitting: Gastroenterology

## 2014-08-04 ENCOUNTER — Telehealth: Payer: Self-pay | Admitting: Gastroenterology

## 2014-08-04 NOTE — Telephone Encounter (Signed)
Tried to call and not accepting calls at this time.

## 2014-08-04 NOTE — Telephone Encounter (Signed)
Please call pt. She had THREE simple adenomas removed.   FOLLOW A HIGH FIBER DIET. AVOID ITEMS THAT CAUSE BLOATING.  Next colonoscopy in 3 years. ALL SISTERS, BROTHERS, CHILDREN, AND PARENTS NEED TO HAVE A COLONOSCOPY STARTING AT THE AGE OF 40.

## 2014-08-05 NOTE — Telephone Encounter (Signed)
Reminder in epic °

## 2014-08-05 NOTE — Telephone Encounter (Signed)
Letter mailed to pt to call for results.  

## 2014-08-12 NOTE — Telephone Encounter (Signed)
Pt called and was informed of her results. High Fiber diet mailed to pt per her request.

## 2015-10-29 ENCOUNTER — Emergency Department (HOSPITAL_COMMUNITY)
Admission: EM | Admit: 2015-10-29 | Discharge: 2015-10-30 | Disposition: A | Discharge status: Home / Self Care | Payer: Medicaid Other | Attending: Emergency Medicine | Admitting: Emergency Medicine

## 2015-10-29 ENCOUNTER — Encounter (HOSPITAL_COMMUNITY): Payer: Self-pay

## 2015-10-29 ENCOUNTER — Emergency Department (HOSPITAL_COMMUNITY): Payer: Medicaid Other

## 2015-10-29 DIAGNOSIS — Y939 Activity, unspecified: Secondary | ICD-10-CM | POA: Diagnosis not present

## 2015-10-29 DIAGNOSIS — R200 Anesthesia of skin: Secondary | ICD-10-CM | POA: Diagnosis present

## 2015-10-29 DIAGNOSIS — S46811A Strain of other muscles, fascia and tendons at shoulder and upper arm level, right arm, initial encounter: Secondary | ICD-10-CM | POA: Insufficient documentation

## 2015-10-29 DIAGNOSIS — I10 Essential (primary) hypertension: Secondary | ICD-10-CM | POA: Insufficient documentation

## 2015-10-29 DIAGNOSIS — Y929 Unspecified place or not applicable: Secondary | ICD-10-CM | POA: Insufficient documentation

## 2015-10-29 DIAGNOSIS — S46812A Strain of other muscles, fascia and tendons at shoulder and upper arm level, left arm, initial encounter: Secondary | ICD-10-CM | POA: Diagnosis not present

## 2015-10-29 DIAGNOSIS — J45909 Unspecified asthma, uncomplicated: Secondary | ICD-10-CM | POA: Diagnosis not present

## 2015-10-29 DIAGNOSIS — Y999 Unspecified external cause status: Secondary | ICD-10-CM | POA: Insufficient documentation

## 2015-10-29 DIAGNOSIS — X58XXXA Exposure to other specified factors, initial encounter: Secondary | ICD-10-CM | POA: Insufficient documentation

## 2015-10-29 LAB — BASIC METABOLIC PANEL
Anion gap: 6 (ref 5–15)
BUN: 10 mg/dL (ref 6–20)
CALCIUM: 8.4 mg/dL — AB (ref 8.9–10.3)
CO2: 26 mmol/L (ref 22–32)
Chloride: 101 mmol/L (ref 101–111)
Creatinine, Ser: 0.6 mg/dL (ref 0.44–1.00)
GFR calc Af Amer: >60 mL/min (ref >60)
Glucose, Bld: 108 mg/dL — ABNORMAL HIGH (ref 65–99)
Potassium: 3.2 mmol/L — ABNORMAL LOW (ref 3.5–5.1)
SODIUM: 133 mmol/L — AB (ref 135–145)

## 2015-10-29 LAB — CBC WITH DIFFERENTIAL/PLATELET
Basophils Absolute: 0 10*3/uL (ref 0.0–0.1)
Basophils Relative: 0 %
EOS PCT: 2 %
Eosinophils Absolute: 0.2 10*3/uL (ref 0.0–0.7)
HCT: 37.5 % (ref 36.0–46.0)
Hemoglobin: 12.4 g/dL (ref 12.0–15.0)
LYMPHS ABS: 4.3 10*3/uL — AB (ref 0.7–4.0)
Lymphocytes Relative: 52 %
MCH: 29.6 pg (ref 26.0–34.0)
MCHC: 33.1 g/dL (ref 30.0–36.0)
MCV: 89.5 fL (ref 78.0–100.0)
MONOS PCT: 5 %
Monocytes Absolute: 0.5 10*3/uL (ref 0.1–1.0)
Neutro Abs: 3.5 10*3/uL (ref 1.7–7.7)
Neutrophils Relative %: 41 %
PLATELETS: 360 10*3/uL (ref 150–400)
RBC: 4.19 MIL/uL (ref 3.87–5.11)
RDW: 14.7 % (ref 11.5–15.5)
WBC: 8.4 10*3/uL (ref 4.0–10.5)

## 2015-10-29 LAB — CBG MONITORING, ED: Glucose-Capillary: 102 mg/dL — ABNORMAL HIGH (ref 65–99)

## 2015-10-29 MED ORDER — KETOROLAC TROMETHAMINE 60 MG/2ML IM SOLN
60.0000 mg | Freq: Once | INTRAMUSCULAR | Status: AC
  Administered 2015-10-30: 60 mg via INTRAMUSCULAR
  Filled 2015-10-29: qty 2

## 2015-10-29 MED ORDER — DIAZEPAM 5 MG/ML IJ SOLN
5.0000 mg | Freq: Once | INTRAMUSCULAR | Status: AC
  Administered 2015-10-30: 5 mg via INTRAMUSCULAR
  Filled 2015-10-29: qty 2

## 2015-10-29 NOTE — ED Notes (Signed)
Pt reports numbness and tingling to bilateral arms, legs, and face that started this week, and has progressively gotten worse.

## 2015-10-29 NOTE — ED Provider Notes (Signed)
CSN: [id number]     Arrival date & time 10/29/15  2152 History  By signing my name below, I, [facility], attest that this documentation has been prepared under the direction and in the presence of Rolland Porter, MD at 2326. Electronically Signed: Virgel Bouquet, ED Scribe. 10/29/2015. 1:39 AM.   Chief Complaint  Patient presents with  . Numbness  . Tingling   The history is provided by the patient. No language interpreter was used.   HPI Comments: Allison [name] is a 50 y.o. female with a PMHx of HTN and asthma who presents to the Emergency Department complaining of constant, gradually worsening numbness and shooting pains in her BUE onset 5 days ago. Patient reports that numbness and tingling began in her BUE, followed by CP 3 days ago . She endorses associated intermittent neck pain for the past couple weeks, intermittent "aching like needles sticking her chest"  CP onset 3 days that last 10-15 minutes (last episode 1.5 hours ago), SOB secondary to CP, diaphoresis, mild bladder incontinence, lower back pain. CP worse when she is upset and yelling and improved by relaxing. Per patient, she notes similar symptoms in her BLE that has been ongoing for years. She states that she has a change in her activity in that she is currently moving into her new house. Denies cigarette smoking or ETOH consumption. Denies nausea, vomiting, bowel incontinence.  PCP Dr Legrand Rams  Past Medical History  Diagnosis Date  . Hypertension   . Asthma    Past Surgical History  Procedure Laterality Date  . Breast surgery    . Cholecystectomy    . Colonoscopy N/A 07/15/2014    Procedure: COLONOSCOPY;  Surgeon: Danie Binder, MD;  Location: [facility];  Service: Endoscopy;  Laterality: N/A;  9:30 AM - moved to 1:00 - Ginger to notify pt   No family history on file. Social History  Substance Use Topics  . Smoking status: Never Smoker   . Smokeless tobacco: None  . Alcohol Use: No   employed  OB  History    No data available     Review of Systems  Constitutional: Positive for diaphoresis.  Respiratory: Positive for shortness of breath.   Cardiovascular: Positive for chest pain.  Gastrointestinal: Negative for nausea and vomiting.  Musculoskeletal: Positive for myalgias (BUE (acute), BLE (chronic)), back pain and neck pain.  Neurological: Positive for numbness.       Positive for tingling.  All other systems reviewed and are negative.     Allergies  Review of patient's allergies indicates no known allergies.  Home Medications   Prior to Admission medications   Medication Sig Start Date End Date Taking? Authorizing Provider  albuterol (PROVENTIL HFA;VENTOLIN HFA) 108 (90 BASE) MCG/ACT inhaler Inhale 2 puffs into the lungs every 4 (four) hours as needed for wheezing.    Lily Kocher, PA-C  cyclobenzaprine (FLEXERIL) 5 MG tablet Take 1 tablet (5 mg total) by mouth 3 (three) times daily as needed for muscle spasms. 10/30/15   Rolland Porter, MD  hydrochlorothiazide 25 MG tablet Take 25 mg by mouth daily.      Historical Provider, MD  lovastatin (MEVACOR) 20 MG tablet Take 20 mg by mouth at bedtime.    Historical Provider, MD  naproxen (NAPROSYN) 500 MG tablet Take 1 po BID with food prn pain 10/30/15   Rolland Porter, MD  traMADol (ULTRAM) 50 MG tablet Take 1 tablet (50 mg total) by mouth every 6 (six) hours as needed.  06/17/14   Dorie Rank, MD   BP 135/94 mmHg  Pulse 93  Temp(Src) 98.1 F (36.7 C) (Oral)  Resp 28  Ht 5\' 1"  (1.549 m)  Wt 230 lb (104.327 kg)  BMI 43.48 kg/m2  SpO2 97%  LMP 09/29/2015  Vital signs normal   Physical Exam  Constitutional: She is oriented to person, place, and time. She appears well-developed and well-nourished.  Non-toxic appearance. She does not appear ill. No distress.  HENT:  Head: Normocephalic and atraumatic.  Right Ear: External ear normal.  Left Ear: External ear normal.  Nose: Nose normal. No mucosal edema or rhinorrhea.  Mouth/Throat:  Oropharynx is clear and moist and mucous membranes are normal. No dental abscesses or uvula swelling.  Eyes: Conjunctivae and EOM are normal. Pupils are equal, round, and reactive to light.  Neck: Normal range of motion and full passive range of motion without pain. Neck supple.    Cardiovascular: Normal rate, regular rhythm and normal heart sounds.  Exam reveals no gallop and no friction rub.   No murmur heard. Pulmonary/Chest: Effort normal and breath sounds normal. No respiratory distress. She has no wheezes. She has no rhonchi. She has no rales. She exhibits no tenderness and no crepitus.  Abdominal: Soft. Normal appearance and bowel sounds are normal. She exhibits no distension. There is no tenderness. There is no rebound and no guarding.  Musculoskeletal: Normal range of motion. She exhibits no edema or tenderness.  Very mild midline cervical spine tenderness. Very tender over the trapezius muscles that reproduces her pain. Tender over her costochondral junctions. Moves all extremities well.   Neurological: She is alert and oriented to person, place, and time. She has normal strength. No cranial nerve deficit.  Skin: Skin is warm, dry and intact. No rash noted. No erythema. No pallor.  Psychiatric: She has a normal mood and affect. Her speech is normal and behavior is normal. Her mood appears not anxious.  Nursing note and vitals reviewed.   ED Course  Procedures   Medications  ketorolac (TORADOL) injection 60 mg (60 mg Intramuscular Given 10/30/15 0021)  diazepam (VALIUM) injection 5 mg (5 mg Intramuscular Given 10/30/15 0021)     DIAGNOSTIC STUDIES: Oxygen Saturation is 98% on RA, normal by my interpretation.    COORDINATION OF CARE: 11:35 PM Will order Toradol, Valium, cervical spine x-ray, and labs. Discussed treatment plan with pt at bedside and pt agreed to plan.  Recheck 01:30 AM, pt states her neck pain and numbness of her upper extremities are much better. Daughter here  to take her home.    Labs Review Results for orders placed or performed during the hospital encounter of 10/29/15  CBC with Differential  Result Value Ref Range   WBC 8.4 4.0 - 10.5 K/uL   RBC 4.19 3.87 - 5.11 MIL/uL   Hemoglobin 12.4 12.0 - 15.0 g/dL   HCT 37.5 36.0 - 46.0 %   MCV 89.5 78.0 - 100.0 fL   MCH 29.6 26.0 - 34.0 pg   MCHC 33.1 30.0 - 36.0 g/dL   RDW 14.7 11.5 - 15.5 %   Platelets 360 150 - 400 K/uL   Neutrophils Relative % 41 %   Neutro Abs 3.5 1.7 - 7.7 K/uL   Lymphocytes Relative 52 %   Lymphs Abs 4.3 (H) 0.7 - 4.0 K/uL   Monocytes Relative 5 %   Monocytes Absolute 0.5 0.1 - 1.0 K/uL   Eosinophils Relative 2 %   Eosinophils Absolute 0.2 0.0 -  0.7 K/uL   Basophils Relative 0 %   Basophils Absolute 0.0 0.0 - 0.1 K/uL  Basic metabolic panel  Result Value Ref Range   Sodium 133 (L) 135 - 145 mmol/L   Potassium 3.2 (L) 3.5 - 5.1 mmol/L   Chloride 101 101 - 111 mmol/L   CO2 26 22 - 32 mmol/L   Glucose, Bld 108 (H) 65 - 99 mg/dL   BUN 10 6 - 20 mg/dL   Creatinine, Ser 0.60 0.44 - 1.00 mg/dL   Calcium 8.4 (L) 8.9 - 10.3 mg/dL   GFR calc non Af Amer >60 >60 mL/min   GFR calc Af Amer >60 >60 mL/min   Anion gap 6 5 - 15  Urinalysis, Routine w reflex microscopic (not at [facility])  Result Value Ref Range   Color, Urine YELLOW YELLOW   APPearance CLEAR CLEAR   Specific Gravity, Urine 1.010 1.005 - 1.030   pH 7.5 5.0 - 8.0   Glucose, UA NEGATIVE NEGATIVE mg/dL   Hgb urine dipstick MODERATE (A) NEGATIVE   Bilirubin Urine NEGATIVE NEGATIVE   Ketones, ur NEGATIVE NEGATIVE mg/dL   Protein, ur 30 (A) NEGATIVE mg/dL   Nitrite NEGATIVE NEGATIVE   Leukocytes, UA NEGATIVE NEGATIVE  Troponin I  Result Value Ref Range   Troponin I <0.03 <0.031 ng/mL  Urine microscopic-add on  Result Value Ref Range   Squamous Epithelial / LPF 0-5 (A) NONE SEEN   WBC, UA 0-5 0 - 5 WBC/hpf   RBC / HPF 0-5 0 - 5 RBC/hpf   Bacteria, UA FEW (A) NONE SEEN  POC CBG, ED  Result Value Ref Range    Glucose-Capillary 102 (H) 65 - 99 mg/dL    Laboratory interpretation all normal except mild hypokalemia    Imaging Review Dg Cervical Spine Complete  10/30/2015  CLINICAL DATA:  50 year old female with neck pain and bilateral hand numbness EXAM: CERVICAL SPINE - COMPLETE 4+ VIEW COMPARISON:  None. FINDINGS: There is no acute fracture or subluxation of the cervical spine. There are degenerative changes primarily at C5-C6 and C6-C7. There is mild reversal of normal cervical lordosis which may be positional or due to muscle spasm. The spinous processes and odontoid are intact. There is anatomic alignment of the lateral masses of the C1 and C2. The soft tissues appear unremarkable. IMPRESSION: No acute fracture or subluxation. Electronically Signed   By: Anner Crete M.D.   On: 10/30/2015 00:11   I have personally reviewed and evaluated these images and lab results as part of my medical decision-making.   EKG Interpretation   Date/Time:  Thursday October 29 2015 23:11:00 EDT Ventricular Rate:  84 PR Interval:  149 QRS Duration: 86 QT Interval:  396 QTC Calculation: 468 R Axis:   -3 Text Interpretation:  Sinus rhythm Abnormal R-wave progression, early  transition Borderline T abnormalities, anterior leads No significant  change since last tracing 17 Aug 2011 Confirmed by [facility]  MD-I, Axtyn Woehler  ([id number]) on 10/29/2015 11:18:09 PM      MDM   Final diagnoses:  Trapezius strain, right, initial encounter  Trapezius strain, left, initial encounter  Numbness of upper extremity    New Prescriptions   CYCLOBENZAPRINE (FLEXERIL) 5 MG TABLET    Take 1 tablet (5 mg total) by mouth 3 (three) times daily as needed for muscle spasms.   NAPROXEN (NAPROSYN) 500 MG TABLET    Take 1 po BID with food prn pain    Plan discharge  Rolland Porter, MD, Abram Sander  I personally performed the services described in this documentation, which was scribed in my presence. The recorded information has been reviewed  and considered.  Rolland Porter, MD, Barbette Or, MD 10/30/15 [phone]

## 2015-10-29 NOTE — ED Notes (Signed)
Patient on cardiac monitoring at this time. EKG done and given to Dr Tomi Bamberger.

## 2015-10-30 LAB — URINALYSIS, ROUTINE W REFLEX MICROSCOPIC
BILIRUBIN URINE: NEGATIVE
GLUCOSE, UA: NEGATIVE mg/dL
Ketones, ur: NEGATIVE mg/dL
Leukocytes, UA: NEGATIVE
Nitrite: NEGATIVE
Protein, ur: 30 mg/dL — AB
Specific Gravity, Urine: 1.01 (ref 1.005–1.030)
pH: 7.5 (ref 5.0–8.0)

## 2015-10-30 LAB — URINE MICROSCOPIC-ADD ON

## 2015-10-30 LAB — TROPONIN I: Troponin I: <0.03 ng/mL (ref <0.031)

## 2015-10-30 MED ORDER — CYCLOBENZAPRINE HCL 5 MG PO TABS: 5.0000 mg | ORAL_TABLET | Freq: Three times a day (TID) | ORAL | Status: DC | PRN

## 2015-10-30 MED ORDER — NAPROXEN 500 MG PO TABS: ORAL_TABLET | ORAL | Status: DC

## 2015-10-30 NOTE — Discharge Instructions (Signed)
Use ice and heat for comfort. Take the medications as prescribed.  Recheck if you are getting worse instead of better.    Cryotherapy Cryotherapy is when you put ice on your injury. Ice helps lessen pain and puffiness (swelling) after an injury. Ice works the best when you start using it in the first 24 to 48 hours after an injury. HOME CARE  Put a dry or damp towel between the ice pack and your skin.  You may press gently on the ice pack.  Leave the ice on for no more than 10 to 20 minutes at a time.  Check your skin after 5 minutes to make sure your skin is okay.  Rest at least 20 minutes between ice pack uses.  Stop using ice when your skin loses feeling (numbness).  Do not use ice on someone who cannot tell you when it hurts. This includes small children and people with memory problems (dementia). GET HELP RIGHT AWAY IF:  You have white spots on your skin.  Your skin turns blue or pale.  Your skin feels waxy or hard.  Your puffiness gets worse. MAKE SURE YOU:   Understand these instructions.  Will watch your condition.  Will get help right away if you are not doing well or get worse.   This information is not intended to replace advice given to you by your health care provider. Make sure you discuss any questions you have with your health care provider.   Document Released: 01/04/2008 Document Revised: 10/10/2011 Document Reviewed: 03/10/2011 Elsevier Interactive Patient Education 2016 [place].  Muscle Strain A muscle strain (pulled muscle) happens when a muscle is stretched beyond normal length. It happens when a sudden, violent force stretches your muscle too far. Usually, a few of the fibers in your muscle are torn. Muscle strain is common in athletes. Recovery usually takes 1-2 weeks. Complete healing takes 5-6 weeks.  HOME CARE   Follow the PRICE method of treatment to help your injury get better. Do this the first 2-3 days after the injury:  Protect.  Protect the muscle to keep it from getting injured again.  Rest. Limit your activity and rest the injured body part.  Ice. Put ice in a plastic bag. Place a towel between your skin and the bag. Then, apply the ice and leave it on from 15-20 minutes each hour. After the third day, switch to moist heat packs.  Compression. Use a splint or elastic bandage on the injured area for comfort. Do not put it on too tightly.  Elevate. Keep the injured body part above the level of your heart.  Only take medicine as told by your doctor.  Warm up before doing exercise to prevent future muscle strains. GET HELP IF:   You have more pain or puffiness (swelling) in the injured area.  You feel numbness, tingling, or notice a loss of strength in the injured area. MAKE SURE YOU:   Understand these instructions.  Will watch your condition.  Will get help right away if you are not doing well or get worse.   This information is not intended to replace advice given to you by your health care provider. Make sure you discuss any questions you have with your health care provider.   Document Released: 04/26/2008 Document Revised: 05/08/2013 Document Reviewed: 02/14/2013 Elsevier Interactive Patient Education [employer].

## 2016-01-01 ENCOUNTER — Emergency Department (HOSPITAL_COMMUNITY)
Admission: EM | Admit: 2016-01-01 | Discharge: 2016-01-01 | Disposition: A | Discharge status: Home / Self Care | Payer: No Typology Code available for payment source | Attending: Emergency Medicine | Admitting: Emergency Medicine

## 2016-01-01 ENCOUNTER — Encounter (HOSPITAL_COMMUNITY): Payer: Self-pay | Admitting: *Deleted

## 2016-01-01 DIAGNOSIS — Y9389 Activity, other specified: Secondary | ICD-10-CM | POA: Diagnosis not present

## 2016-01-01 DIAGNOSIS — M542 Cervicalgia: Secondary | ICD-10-CM | POA: Insufficient documentation

## 2016-01-01 DIAGNOSIS — J45909 Unspecified asthma, uncomplicated: Secondary | ICD-10-CM | POA: Diagnosis not present

## 2016-01-01 DIAGNOSIS — Z79899 Other long term (current) drug therapy: Secondary | ICD-10-CM | POA: Diagnosis not present

## 2016-01-01 DIAGNOSIS — I10 Essential (primary) hypertension: Secondary | ICD-10-CM | POA: Diagnosis not present

## 2016-01-01 DIAGNOSIS — Y999 Unspecified external cause status: Secondary | ICD-10-CM | POA: Insufficient documentation

## 2016-01-01 DIAGNOSIS — Y92410 Unspecified street and highway as the place of occurrence of the external cause: Secondary | ICD-10-CM | POA: Insufficient documentation

## 2016-01-01 MED ORDER — OXYCODONE-ACETAMINOPHEN 5-325 MG PO TABS
1.0000 | ORAL_TABLET | Freq: Once | ORAL | Status: AC
  Administered 2016-01-01: 1 via ORAL
  Filled 2016-01-01: qty 1

## 2016-01-01 MED ORDER — METHOCARBAMOL 500 MG PO TABS: 500.0000 mg | ORAL_TABLET | Freq: Two times a day (BID) | ORAL | Status: DC

## 2016-01-01 MED ORDER — NAPROXEN 500 MG PO TABS: 500.0000 mg | ORAL_TABLET | Freq: Two times a day (BID) | ORAL | Status: DC
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
## 2016-01-01 NOTE — Discharge Instructions (Signed)

## 2016-01-01 NOTE — ED Notes (Signed)
Pt placed in c-collar. On and aligned.

## 2016-01-04 ENCOUNTER — Other Ambulatory Visit (HOSPITAL_COMMUNITY): Payer: Self-pay | Admitting: Internal Medicine

## 2016-01-04 ENCOUNTER — Ambulatory Visit (HOSPITAL_COMMUNITY)
Admission: RE | Admit: 2016-01-04 | Discharge: 2016-01-04 | Disposition: A | Discharge status: Home / Self Care | Payer: Medicaid Other | Source: Ambulatory Visit | Attending: Internal Medicine | Admitting: Internal Medicine

## 2016-01-04 DIAGNOSIS — M542 Cervicalgia: Secondary | ICD-10-CM

## 2016-01-13 ENCOUNTER — Ambulatory Visit (HOSPITAL_COMMUNITY): Payer: No Typology Code available for payment source | Attending: Internal Medicine | Admitting: Physical Therapy

## 2016-01-13 ENCOUNTER — Encounter (HOSPITAL_COMMUNITY): Payer: Self-pay | Admitting: Physical Therapy

## 2016-01-13 DIAGNOSIS — R252 Cramp and spasm: Secondary | ICD-10-CM | POA: Diagnosis present

## 2016-01-13 DIAGNOSIS — M6281 Muscle weakness (generalized): Secondary | ICD-10-CM | POA: Insufficient documentation

## 2016-01-13 DIAGNOSIS — M542 Cervicalgia: Secondary | ICD-10-CM | POA: Insufficient documentation

## 2016-01-13 DIAGNOSIS — R293 Abnormal posture: Secondary | ICD-10-CM | POA: Insufficient documentation
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
## 2016-01-13 NOTE — Patient Instructions (Signed)
   SCAPULAR RETRACTIONS  Draw your shoulder blades back and down. It should feel like you are squeezing the muscles between your shoulder blades.  Repeat 10 times, 2-3 times per day.     Shoulder Rolls  Sit with arm resting on a table (or bed). Start with arm circles, focusing on moving the shoulder while keeping the arm resting on the table. Do a couple seconds circling backwards, then a few forwards. The general motion is up, back, down with your shoulders.  Repeat 10-15 times, 2-3 times per day. You do not need to have your arms elevated as shown in the picture.     RETRACTION / CHIN TUCK  Slowly draw your head back so that your ears line up with your shoulders. Make sure you are keeping your face on the same plane and not looking up or down with this exercise.  Repeat 10 times, 2-3 times per day.

## 2016-01-18 ENCOUNTER — Encounter (HOSPITAL_COMMUNITY): Payer: Self-pay | Admitting: Physical Therapy

## 2016-01-18 ENCOUNTER — Telehealth (HOSPITAL_COMMUNITY): Payer: Self-pay | Admitting: Physical Therapy

## 2016-01-18 NOTE — Telephone Encounter (Signed)
Pt did not show for appt.  Called and spoke to patient who stated she forgot her appt.  Reminded of next appt on 6/27 at 1:45 pm Teena Irani, PTA/CLT [phone]

## 2016-01-26 ENCOUNTER — Ambulatory Visit (HOSPITAL_COMMUNITY): Payer: No Typology Code available for payment source

## 2016-01-26 DIAGNOSIS — R252 Cramp and spasm: Secondary | ICD-10-CM

## 2016-01-26 DIAGNOSIS — M542 Cervicalgia: Secondary | ICD-10-CM | POA: Diagnosis not present

## 2016-01-26 DIAGNOSIS — M6281 Muscle weakness (generalized): Secondary | ICD-10-CM

## 2016-01-26 DIAGNOSIS — R293 Abnormal posture: Secondary | ICD-10-CM

## 2016-01-26 NOTE — Therapy (Signed)
[place], [place], [address] Phone: [phone]   Fax:  [phone]  Physical Therapy Treatment  Patient Details  Name: Allison [name] MRN: [id number] Date of Birth: 15-Aug-1965 Referring Provider: Rosita Fire   Encounter Date: 01/26/2016      PT End of Session - 01/26/16 1357    Visit Number 2   Number of Visits 12   Date for PT Re-Evaluation 02/03/16   Authorization Type MedPay and Medicaid    Authorization Time Period 01/13/16 to 02/24/16   PT Start Time 1352   PT Stop Time 1430   PT Time Calculation (min) 38 min   Activity Tolerance Patient tolerated treatment well   Behavior During Therapy [facility] for tasks assessed/performed      Past Medical History  Diagnosis Date  . Hypertension   . Asthma     Past Surgical History  Procedure Laterality Date  . Breast surgery    . Cholecystectomy    . Colonoscopy N/A 07/15/2014    Procedure: COLONOSCOPY;  Surgeon: Danie Binder, MD;  Location[facility];  Service: Endoscopy;  Laterality: N/A;  9:30 AM - moved to 1:00 - Ginger to notify pt    There were no vitals filed for this visit.      Subjective Assessment - 01/26/16 1356    Subjective Pt entered dept with smile stating her neck feels a lot better, able to rotate head to WNL with no pain.  Reports compliance with HEP and has been getting massage from kids at home.     Pertinent History no significant PMH/PSH per chart review    Patient Stated Goals get full ROM back, get rid of pain    Currently in Pain? No/denies             [facility] Adult PT Treatment/Exercise - 01/26/16 0001    Posture/Postural Control   Posture/Postural Control Postural limitations   Postural Limitations Rounded Shoulders;Forward head;Decreased thoracic kyphosis   Exercises   Exercises Neck   Neck Exercises: Theraband   Scapula Retraction 10 reps;Red   Shoulder Extension 10 reps;Red   Rows 10 reps;Red   Neck  Exercises: Seated   Neck Retraction 15 reps;3 secs   Neck Retraction Limitations min cueing for form   Cervical Rotation 10 reps   Cervical Rotation Limitations 3D cervical excursion   X to V 10 reps   W Back 15 reps   Shoulder Rolls Backwards;10 reps   Postural Training Educated importance of proper posture with landmarks  given   Other Seated Exercise scapular retraction   Manual Therapy   Manual Therapy Soft tissue mobilization   Manual therapy comments Manual complete separate rest of treatment   Soft tissue mobilization Supine position to cervical mm with LE elevated           PT Short Term Goals - 01/13/16 1003    PT SHORT TERM GOAL #1   Title Patient will demonstrate correct posture at leaset 75% of the time in order to assist in reducing pain and improving general mechanics    Time 3   Period Weeks   Status New   PT SHORT TERM GOAL #2   Title Patient will demosntrate ROM WFL in all planes of cervical spine and bilateral shoulders in order to reduce pain adn improve function    Time 3   Period Weeks   Status New   PT SHORT TERM GOAL #3   Title  Patient will experience no more than 3/10 pain at worst in order to improve function and overall QOL    Time 3   Period Weeks   Status New   PT SHORT TERM GOAL #4   Title Patient will be independent in correctly and consistently performing appropriate HEP, to be updated PRN    Time 3   Period Weeks   Status New           PT Long Term Goals - 01/13/16 1007    PT LONG TERM GOAL #1   Title Patient will demonstrate strength 4+/5 in all tested muscle groups in order to improve funtion and reduce pain    Time 6   Period Weeks   Status New   PT LONG TERM GOAL #2   Title Patient to demonstrate correct functional lifting mechancis in order to prevent exacerbation of pain during regular job duties    Time 6   Period Weeks   Status New   PT LONG TERM GOAL #3   Title Patient to report she has been able to return to full  duties at work with pain no more than 2/10 in order to improve QOL and assist in return to PLOF    Time 6   Period Weeks   Status New   PT LONG TERM GOAL #4   Title Patient to be participatory in regular exercise program, at least 20 minutes at least 3 days per week, in order to maintain functional gains and improve overall health status    Time 6   Period Weeks   Status New               Plan - 01/26/16 1455    Clinical Impression Statement Reviewed goals, compliance and assured correct form with HEP and copy of eval given to pt.  Session focus on improving cervical mobility and education of importance of proper posture.  Pt improved cervical ROM to almost WNL and no reports of pain.  Minimal cueing required for form with postural strengthening exercises to reduce forward head and rolled shoulders.  Progressed to theraband for postural strengthening with minimal cueing required.  Ended session with manual soft tissue mobilizaiton to reduce tightness, able to reduce upper trap tightness.  No reports of pain through session.     Rehab Potential Good   PT Frequency 2x / week   PT Duration 6 weeks   PT Treatment/Interventions ADLs/Self Care Home Management;Cryotherapy;Moist Heat;Gait training;Functional mobility training;Therapeutic activities;Therapeutic exercise;Balance training;Neuromuscular re-education;Patient/family education;Manual techniques;Passive range of motion;Taping   PT Next Visit Plan posture training, functional strength of cervical and scapular stabilizers; manual, ice, heat PRN   Progress to increased resistance with theraband and give as HEP if good form noted next session   PT [place] reviewed no additional exercises given this session.        Patient will benefit from skilled therapeutic intervention in order to improve the following deficits and impairments:  Hypomobility, Decreased strength, Pain, Impaired UE functional use, Increased muscle spasms,  Decreased range of motion, Improper body mechanics, Decreased coordination, Impaired flexibility, Postural dysfunction  Visit Diagnosis: Cervicalgia  Abnormal posture  Cramp and spasm  Muscle weakness (generalized)     Problem List There are no active problems to display for this patient.  [address] LPTA; [place]  Aldona Lento 01/26/2016, 6:06 PM  [place] [address] [place], [place], [address] Phone: [phone]   Fax:  [phone]  Name: Allison [name] MRN: [id number] Date of Birth: [birth year]

## 2016-01-28 ENCOUNTER — Ambulatory Visit (HOSPITAL_COMMUNITY): Payer: No Typology Code available for payment source

## 2016-01-28 DIAGNOSIS — M6281 Muscle weakness (generalized): Secondary | ICD-10-CM

## 2016-01-28 DIAGNOSIS — M542 Cervicalgia: Secondary | ICD-10-CM

## 2016-01-28 DIAGNOSIS — R293 Abnormal posture: Secondary | ICD-10-CM

## 2016-01-28 DIAGNOSIS — R252 Cramp and spasm: Secondary | ICD-10-CM

## 2016-01-28 NOTE — Therapy (Signed)
[place], [place], [address] Phone: [phone]   Fax:  [phone]  Physical Therapy Treatment  Patient Details  Name: Allison [name] MRN: [id number] Date of Birth: [birth year] Referring Provider: Rosita Fire   Encounter Date: 01/28/2016      PT End of Session - 01/28/16 0959    Visit Number 3   Number of Visits 12   Date for PT Re-Evaluation 02/03/16   Authorization Type MedPay and Medicaid    Authorization Time Period 01/13/16 to 02/24/16   PT Start Time 0945   PT Stop Time 1034   PT Time Calculation (min) 49 min   Activity Tolerance Patient tolerated treatment well   Behavior During Therapy [facility] for tasks assessed/performed      Past Medical History  Diagnosis Date  . Hypertension   . Asthma     Past Surgical History  Procedure Laterality Date  . Breast surgery    . Cholecystectomy    . Colonoscopy N/A 07/15/2014    Procedure: COLONOSCOPY;  Surgeon: Danie Binder, MD;  Location[facility];  Service: Endoscopy;  Laterality: N/A;  9:30 AM - moved to 1:00 - Ginger to notify pt    There were no vitals filed for this visit.      Subjective Assessment - 01/28/16 0948    Subjective Pt stated her neck is feeling great today.  Pt c/o Bil knee pain today, 8-9/10.feels she needs replacement though   Pertinent History no significant PMH/PSH per chart review    Patient Stated Goals get full ROM back, get rid of pain    Currently in Pain? No/denies   Multiple Pain Sites Yes   Pain Score 9   Pain Location Knee   Pain Descriptors / Indicators Constant;Aching   Pain Type Acute pain   Pain Radiating Towards posterior ankles going up to knees   Pain Onset More than a month ago   Pain Frequency Constant   Aggravating Factors  constant swelling   Pain Relieving Factors sleep   Effect of Pain on Daily Activities standing and walking distances              [facility] Adult PT Treatment/Exercise -  01/28/16 0001    Neck Exercises: Theraband   Scapula Retraction 15 reps;Red   Scapula Retraction Limitations Begin GTB next session and give as HEP if good form   Shoulder Extension 15 reps;Red   Shoulder Extension Limitations Begin GTB next session and give as HEP if good form   Rows 15 reps;Red   Rows Limitations Begin GTB next session and give as HEP if good form   Neck Exercises: Standing   Neck Retraction 10 reps   Neck Retraction Limitations against wall with UE flexion, cueing for ab set to reduce increased  lordacic curve   Wall Push Ups 10 reps   Upper Extremity Flexion with Stabilization Flexion;10 reps   UE Flexion with Stabilization Limitations against wall with UE flexion, cueing for ab set to reduce increased  lordacic curve   Neck Exercises: Seated   Neck Retraction 15 reps;3 secs   Neck Retraction Limitations min cueing for form   Cervical Rotation 10 reps   Cervical Rotation Limitations 3D cervical excursion   X to V 15 reps   W Back 15 reps   Neck Exercises: Prone   Shoulder Extension 10 reps   Rows Limitations 10x 3#   Manual Therapy   Manual Therapy Soft  tissue mobilization   Manual therapy comments Manual complete separate rest of treatment   Soft tissue mobilization Supine position to cervical mm with LE elevated            PT Short Term Goals - 01/13/16 1003    PT SHORT TERM GOAL #1   Title Patient will demonstrate correct posture at leaset 75% of the time in order to assist in reducing pain and improving general mechanics    Time 3   Period Weeks   Status New   PT SHORT TERM GOAL #2   Title Patient will demosntrate ROM WFL in all planes of cervical spine and bilateral shoulders in order to reduce pain adn improve function    Time 3   Period Weeks   Status New   PT SHORT TERM GOAL #3   Title Patient will experience no more than 3/10 pain at worst in order to improve function and overall QOL    Time 3   Period Weeks   Status New   PT SHORT TERM  GOAL #4   Title Patient will be independent in correctly and consistently performing appropriate HEP, to be updated PRN    Time 3   Period Weeks   Status New           PT Long Term Goals - 01/13/16 1007    PT LONG TERM GOAL #1   Title Patient will demonstrate strength 4+/5 in all tested muscle groups in order to improve funtion and reduce pain    Time 6   Period Weeks   Status New   PT LONG TERM GOAL #2   Title Patient to demonstrate correct functional lifting mechancis in order to prevent exacerbation of pain during regular job duties    Time 6   Period Weeks   Status New   PT LONG TERM GOAL #3   Title Patient to report she has been able to return to full duties at work with pain no more than 2/10 in order to improve QOL and assist in return to PLOF    Time 6   Period Weeks   Status New   PT LONG TERM GOAL #4   Title Patient to be participatory in regular exercise program, at least 20 minutes at least 3 days per week, in order to maintain functional gains and improve overall health status    Time 6   Period Weeks   Status New               Plan - 01/28/16 1023    Clinical Impression Statement Pt presents pain free for the last couple of weeks, improved cervical and shoulder ROM WFL and progressing well towards posture strengthening exercises.  Began standing and prone postural strenghtening exercises with min cueing for form and technqiue.  Ended session with manual soft tissue mobiliztion to cervical muscuaoture to reduce overall tightness, no spasms palpated this session just tightness.  Discussion held with great progress towards goals, recommended referral for massage therapist in the community, given printout with therapists in the area.     Rehab Potential Good   PT Frequency 2x / week   PT Duration 6 weeks   PT Treatment/Interventions ADLs/Self Care Home Management;Cryotherapy;Moist Heat;Gait training;Functional mobility training;Therapeutic  activities;Therapeutic exercise;Balance training;Neuromuscular re-education;Patient/family education;Manual techniques;Passive range of motion;Taping   PT Next Visit Plan Review form with theraband posture stregthening with GTB, give as HEP if good form.  Progress to squats and proper lifting next session.  Anticipate DC early due to great progress with goals, ROM WFL, pain free and compliant with HEP.        Patient will benefit from skilled therapeutic intervention in order to improve the following deficits and impairments:  Hypomobility, Decreased strength, Pain, Impaired UE functional use, Increased muscle spasms, Decreased range of motion, Improper body mechanics, Decreased coordination, Impaired flexibility, Postural dysfunction  Visit Diagnosis: Cervicalgia  Abnormal posture  Cramp and spasm  Muscle weakness (generalized)     Problem List There are no active problems to display for this patient.  [address] LPTA; [place]  Aldona Lento 01/28/2016, 11:56 AM  [place], [place], [address] Phone: [phone]   Fax:  [phone]  Name: Allison [name] MRN: [id number] Date of Birth: 08/21/1965

## 2016-02-10 ENCOUNTER — Ambulatory Visit (HOSPITAL_COMMUNITY): Payer: No Typology Code available for payment source | Attending: Internal Medicine

## 2017-07-06 ENCOUNTER — Encounter: Payer: Self-pay | Admitting: Gastroenterology

## 2017-08-20 ENCOUNTER — Other Ambulatory Visit: Payer: Self-pay

## 2017-08-20 ENCOUNTER — Emergency Department (HOSPITAL_COMMUNITY)
Admission: EM | Admit: 2017-08-20 | Discharge: 2017-08-20 | Disposition: A | Discharge status: Home / Self Care | Payer: Self-pay | Attending: Emergency Medicine | Admitting: Emergency Medicine

## 2017-08-20 ENCOUNTER — Emergency Department (HOSPITAL_COMMUNITY): Payer: Self-pay

## 2017-08-20 ENCOUNTER — Encounter (HOSPITAL_COMMUNITY): Payer: Self-pay | Admitting: *Deleted

## 2017-08-20 DIAGNOSIS — R0602 Shortness of breath: Secondary | ICD-10-CM | POA: Insufficient documentation

## 2017-08-20 DIAGNOSIS — J45909 Unspecified asthma, uncomplicated: Secondary | ICD-10-CM | POA: Insufficient documentation

## 2017-08-20 DIAGNOSIS — Z79899 Other long term (current) drug therapy: Secondary | ICD-10-CM | POA: Insufficient documentation

## 2017-08-20 DIAGNOSIS — Z9049 Acquired absence of other specified parts of digestive tract: Secondary | ICD-10-CM | POA: Insufficient documentation

## 2017-08-20 DIAGNOSIS — I10 Essential (primary) hypertension: Secondary | ICD-10-CM | POA: Insufficient documentation

## 2017-08-20 HISTORY — DX: Obesity, unspecified: E66.9

## 2017-08-20 LAB — BASIC METABOLIC PANEL
Anion gap: 13 (ref 5–15)
BUN: 7 mg/dL (ref 6–20)
CHLORIDE: 101 mmol/L (ref 101–111)
CO2: 24 mmol/L (ref 22–32)
Calcium: 9.1 mg/dL (ref 8.9–10.3)
Creatinine, Ser: 0.78 mg/dL (ref 0.44–1.00)
GFR calc non Af Amer: >60 mL/min (ref >60)
Glucose, Bld: 116 mg/dL — ABNORMAL HIGH (ref 65–99)
POTASSIUM: 3.3 mmol/L — AB (ref 3.5–5.1)
Sodium: 138 mmol/L (ref 135–145)

## 2017-08-20 LAB — CBC
HEMATOCRIT: 39.2 % (ref 36.0–46.0)
HEMOGLOBIN: 13 g/dL (ref 12.0–15.0)
MCH: 30 pg (ref 26.0–34.0)
MCHC: 33.2 g/dL (ref 30.0–36.0)
MCV: 90.3 fL (ref 78.0–100.0)
Platelets: 383 10*3/uL (ref 150–400)
RBC: 4.34 MIL/uL (ref 3.87–5.11)
RDW: 15.4 % (ref 11.5–15.5)
WBC: 6.5 10*3/uL (ref 4.0–10.5)

## 2017-08-20 LAB — I-STAT TROPONIN, ED
TROPONIN I, POC: 0 ng/mL (ref 0.00–0.08)
Troponin i, poc: 0 ng/mL (ref 0.00–0.08)

## 2017-08-20 LAB — BRAIN NATRIURETIC PEPTIDE: B Natriuretic Peptide: 2.9 pg/mL (ref 0.0–100.0)

## 2017-08-20 LAB — D-DIMER, QUANTITATIVE: D-Dimer, Quant: 0.31 ug/mL-FEU (ref 0.00–0.50)

## 2017-08-20 MED ORDER — ALBUTEROL SULFATE HFA 108 (90 BASE) MCG/ACT IN AERS
2.0000 | INHALATION_SPRAY | Freq: Once | RESPIRATORY_TRACT | Status: AC
  Administered 2017-08-20: 2 via RESPIRATORY_TRACT
  Filled 2017-08-20: qty 6.7

## 2017-08-20 MED ORDER — POTASSIUM CHLORIDE CRYS ER 20 MEQ PO TBCR
40.0000 meq | EXTENDED_RELEASE_TABLET | Freq: Once | ORAL | Status: AC
  Administered 2017-08-20: 40 meq via ORAL
  Filled 2017-08-20: qty 2

## 2017-08-20 NOTE — ED Triage Notes (Signed)
Pt reports sob with mild exertion for several days. Has ongoing swelling to her feet and ankles but states that's her norm. Has cough but no cold symptoms, states cough and sob gets worse when lying down at night. Denies cardiac hx.

## 2017-08-20 NOTE — ED Provider Notes (Signed)
[place] EMERGENCY DEPARTMENT Provider Note   CSN: [id number] Arrival date & time: 08/20/17  0263     History   Chief Complaint Chief Complaint  Patient presents with  . Shortness of Breath    HPI Allison [name] is a 52 y.o. female.  HPI   52 year old female with history of hypertension, bronchitis, however denies history of asthma for shortness of breath.  Reports the shortness of breath has been present over weeks, however was worse yesterday.  Reports she notes dyspnea on exertion.  Also reports shortness of breath when she lays down flat, but reports it does not necessarily improve if she sits up.  Reports that she feels short of breath with even minimal exertion.  Reports that she does have tiny sharp sticking pains in her chest that are spontaneous, come and go, are not related to exertion.  Denies nausea, vomiting, diarrhea.  Reports that she has had a cough for the past 3 months that is not productive.  Denies smoking cigarettes, other drugs.  Denies history of DVT, recent surgeries or immobilization, long trips.  Reports bilateral leg swelling which has been ongoing for several months, however is not clinically worsen.  Patient reports that she feels she should eat less fried food, reporting that she eats it every day.    Past Medical History:  Diagnosis Date  . Asthma   . Hypertension   . Obesity     There are no active problems to display for this patient.   Past Surgical History:  Procedure Laterality Date  . BREAST SURGERY    . CHOLECYSTECTOMY    . COLONOSCOPY N/A 07/15/2014   Procedure: COLONOSCOPY;  Surgeon: Danie Binder, MD;  [facility];  Service: Endoscopy;  Laterality: N/A;  9:30 AM - moved to 1:00 - Ginger to notify pt    OB History    No data available       Home Medications    Prior to Admission medications   Medication Sig Start Date End Date Taking? Authorizing Provider  albuterol (PROVENTIL  HFA;VENTOLIN HFA) 108 (90 BASE) MCG/ACT inhaler Inhale 2 puffs into the lungs every 4 (four) hours as needed for wheezing.   Yes Lily Kocher, PA-C  cyclobenzaprine (FLEXERIL) 5 MG tablet Take 1 tablet (5 mg total) by mouth 3 (three) times daily as needed for muscle spasms. 10/30/15  Yes Rolland Porter, MD  hydrochlorothiazide 25 MG tablet Take 25 mg by mouth daily.     Yes [provider]  naproxen (NAPROSYN) 500 MG tablet Take 1 tablet (500 mg total) by mouth 2 (two) times daily. Patient taking differently: Take 500 mg by mouth daily as needed for mild pain.  01/01/16  Yes Etta Quill, NP  traMADol (ULTRAM) 50 MG tablet Take 1 tablet (50 mg total) by mouth every 6 (six) hours as needed. Patient taking differently: Take 50 mg by mouth every 6 (six) hours as needed for moderate pain.  06/17/14  Yes Dorie Rank, MD  methocarbamol (ROBAXIN) 500 MG tablet Take 1 tablet (500 mg total) by mouth 2 (two) times daily. Patient not taking: Reported on 08/20/2017 01/01/16   Etta Quill, NP    Family History History reviewed. No pertinent family history.  Social History Social History   Tobacco Use  . Smoking status: Never Smoker  Substance Use Topics  . Alcohol use: No  . Drug use: No     Allergies   Patient has no known allergies.  Review of Systems Review of Systems  Constitutional: Positive for fatigue. Negative for fever.  HENT: Negative for sore throat.   Eyes: Negative for visual disturbance.  Respiratory: Positive for cough and shortness of breath.   Cardiovascular: Positive for chest pain and leg swelling.  Gastrointestinal: Negative for abdominal pain, nausea and vomiting.  Genitourinary: Negative for difficulty urinating and dysuria.  Musculoskeletal: Negative for back pain and neck pain.  Skin: Negative for rash.  Neurological: Negative for syncope and headaches.     Physical Exam Updated Vital Signs BP 138/88 (BP Location: Left Arm)   Pulse 92   Temp 98.1 F (36.7  C) (Oral)   Resp 15   Ht 5\' 1"  (1.549 m)   Wt 113.4 kg (250 lb)   SpO2 96%   BMI 47.24 kg/m   Physical Exam  Constitutional: She is oriented to person, place, and time. She appears well-developed and well-nourished. No distress.  HENT:  Head: Normocephalic and atraumatic.  Eyes: Conjunctivae and EOM are normal.  Neck: Normal range of motion.  Cardiovascular: Normal rate, regular rhythm, normal heart sounds and intact distal pulses. Exam reveals no gallop and no friction rub.  No murmur heard. Pulmonary/Chest: Effort normal and breath sounds normal. No respiratory distress. She has no wheezes. She has no rales.  Abdominal: Soft. She exhibits no distension. There is no tenderness. There is no guarding.  Musculoskeletal: She exhibits no tenderness.       Right lower leg: She exhibits edema (trace).       Left lower leg: She exhibits edema (trace).  Neurological: She is alert and oriented to person, place, and time.  Skin: Skin is warm and dry. No rash noted. She is not diaphoretic. No erythema.  Nursing note and vitals reviewed.    ED Treatments / Results  Labs (all labs ordered are listed, but only abnormal results are displayed) Labs Reviewed  BASIC METABOLIC PANEL - Abnormal; Notable for the following components:      Result Value   Potassium 3.3 (*)    Glucose, Bld 116 (*)    All other components within normal limits  CBC  BRAIN NATRIURETIC PEPTIDE  D-DIMER, QUANTITATIVE (NOT AT [facility])  I-STAT TROPONIN, ED  I-STAT TROPONIN, ED    EKG  EKG Interpretation  Date/Time:  Sunday August 20 2017 08:51:23 EST Ventricular Rate:  92 PR Interval:  142 QRS Duration: 76 QT Interval:  390 QTC Calculation: 482 R Axis:   18 Text Interpretation:  Normal sinus rhythm Low voltage QRS Prolonged QT Abnormal ECG No significant change since last tracing Confirmed by Gareth Morgan [phone]) on 08/20/2017 11:28:05 AM       Radiology Dg Chest 2 View  Result Date:  08/20/2017 CLINICAL DATA:  52 year old female with history of shortness breath for 1 month, worsening yesterday. EXAM: CHEST  2 VIEW COMPARISON:  Chest x-ray 08/2011. FINDINGS: Mild diffuse peribronchial cuffing. Lung volumes are normal. No consolidative airspace disease. No pleural effusions. No pneumothorax. No pulmonary nodule or mass noted. Pulmonary vasculature and the cardiomediastinal silhouette are within normal limits. IMPRESSION: 1. Mild diffuse peribronchial cuffing concerning for an acute bronchitis. Electronically Signed   By: Vinnie Langton M.D.   On: 08/20/2017 09:37    Procedures Procedures (including critical care time)  Medications Ordered in ED Medications  albuterol (PROVENTIL HFA;VENTOLIN HFA) 108 (90 Base) MCG/ACT inhaler 2 puff (2 puffs Inhalation Given 08/20/17 1420)  potassium chloride SA (K-DUR,KLOR-CON) CR tablet 40 mEq (40 mEq Oral Given 08/20/17 1420)  Initial Impression / Assessment and Plan / ED Course  I have reviewed the triage vital signs and the nursing notes.  Pertinent labs & imaging results that were available during my care of the patient were reviewed by me and considered in my medical decision making (see chart for details).     52 year old female with history of hypertension, bronchitis, however denies history of asthma for shortness of breath.  EKG without significant changes in comparison to prior.  Does not troponins but negative, low suspicion for ACS by history, physical, and lab work.  D-dimer is negative, patient is low risk [place] and have low suspicion for pulmonary embolus.  BNP is negative, and chest x-ray shows no signs of pulmonary edema, have low suspicion for congestive heart failure as etiology of shortness of breath.  Chest x-ray shows no signs of pneumonia or new thorax.  Chest x-ray does show some bronchial cough cuffing which may represent bronchitis.  Overall, history is not consistent with acute bacterial bronchitis.  Do not hear  wheezing on exam.  Will give albuterol inhaler 2 see if it helps symptoms, and recommend primary care physician follow-up.  Given potassium replacement.  Labs otherwise show no significant anemia or other emergent etiology of dyspnea.  Discussed lifestyle changes with pt and recommendation for close PCP follow up.   Final Clinical Impressions(s) / ED Diagnoses   Final diagnoses:  Shortness of breath    ED Discharge Orders    None       Gareth Morgan, MD 08/20/17 1504

## 2018-05-07 ENCOUNTER — Encounter (HOSPITAL_COMMUNITY): Payer: Self-pay | Admitting: Physical Therapy

## 2018-05-07 NOTE — Therapy (Signed)
[place], [place], [address] Phone: [phone]   Fax:  [phone]  Patient Details  Name: Allison [name] MRN: [id number] Date of Birth: [birth year] Referring Provider:  No ref. provider found  Encounter Date: 05/07/2018  PHYSICAL THERAPY DISCHARGE SUMMARY  Visits from Start of Care: 3  Current functional level related to goals / functional outcomes: DC    Remaining deficits: Unknown    Education / Equipment: None  Plan: Patient agrees to discharge.  Patient goals were not met. Patient is being discharged due to not returning since the last visit.  ?????       Deniece Ree PT, DPT, CBIS  Supplemental Physical Therapist [facility]    Pager [phone] Acute Rehab Office [facility] [address] [place], [place], [address] Phone: [phone]   Fax:  [phone]

## 2018-08-22 ENCOUNTER — Other Ambulatory Visit: Payer: Self-pay

## 2018-08-22 ENCOUNTER — Encounter (HOSPITAL_COMMUNITY): Payer: Self-pay | Admitting: *Deleted

## 2018-08-22 DIAGNOSIS — I10 Essential (primary) hypertension: Secondary | ICD-10-CM | POA: Insufficient documentation

## 2018-08-22 DIAGNOSIS — Z79899 Other long term (current) drug therapy: Secondary | ICD-10-CM | POA: Insufficient documentation

## 2018-08-22 DIAGNOSIS — J45909 Unspecified asthma, uncomplicated: Secondary | ICD-10-CM | POA: Insufficient documentation

## 2018-08-22 DIAGNOSIS — J111 Influenza due to unidentified influenza virus with other respiratory manifestations: Secondary | ICD-10-CM | POA: Insufficient documentation

## 2018-08-22 DIAGNOSIS — Z9049 Acquired absence of other specified parts of digestive tract: Secondary | ICD-10-CM | POA: Insufficient documentation

## 2018-08-22 NOTE — ED Triage Notes (Signed)
Pt c/o generalized body aches x 3 days, with chills

## 2018-08-23 ENCOUNTER — Emergency Department (HOSPITAL_COMMUNITY)
Admission: EM | Admit: 2018-08-23 | Discharge: 2018-08-23 | Disposition: A | Discharge status: Home / Self Care | Payer: Self-pay | Attending: Emergency Medicine | Admitting: Emergency Medicine

## 2018-08-23 DIAGNOSIS — J111 Influenza due to unidentified influenza virus with other respiratory manifestations: Secondary | ICD-10-CM

## 2018-08-23 MED ORDER — BENZONATATE 100 MG PO CAPS: 100.0000 mg | ORAL_CAPSULE | Freq: Three times a day (TID) | ORAL | 0 refills | Status: DC

## 2018-08-23 MED ORDER — OSELTAMIVIR PHOSPHATE 75 MG PO CAPS: 75.0000 mg | ORAL_CAPSULE | Freq: Two times a day (BID) | ORAL | 0 refills | Status: DC

## 2018-08-23 MED ORDER — ACETAMINOPHEN 500 MG PO TABS
1000.0000 mg | ORAL_TABLET | Freq: Once | ORAL | Status: AC
  Administered 2018-08-23: 1000 mg via ORAL
  Filled 2018-08-23: qty 2

## 2018-08-23 MED ORDER — PREDNISONE 20 MG PO TABS: 40.0000 mg | ORAL_TABLET | Freq: Every day | ORAL | 0 refills | Status: DC

## 2018-08-23 MED ORDER — OSELTAMIVIR PHOSPHATE 75 MG PO CAPS
75.0000 mg | ORAL_CAPSULE | Freq: Once | ORAL | Status: AC
  Administered 2018-08-23: 75 mg via ORAL
  Filled 2018-08-23: qty 1

## 2018-08-23 MED ORDER — IBUPROFEN 800 MG PO TABS
800.0000 mg | ORAL_TABLET | Freq: Once | ORAL | Status: AC
  Administered 2018-08-23: 800 mg via ORAL
  Filled 2018-08-23: qty 1

## 2018-08-23 NOTE — ED Provider Notes (Signed)
[facility] EMERGENCY DEPARTMENT Provider Note   CSN: [id number] Arrival date & time: 08/22/18  5361     History   Chief Complaint Chief Complaint  Patient presents with  . Generalized Body Aches    HPI Allison [name] is a 53 y.o. female.   URI  Presenting symptoms: congestion, cough, ear pain, fever and sore throat   Congestion:    Location:  Nasal Cough:    Cough characteristics:  Productive   Sputum characteristics:  Yellow   Severity:  Moderate   Onset quality:  Gradual   Duration:  3 days   Timing:  Constant   Progression:  Worsening   Chronicity:  New Ear pain:    Location:  Bilateral   Severity:  Mild   Onset quality:  Gradual   Duration:  3 days Severity:  Moderate Onset quality:  Gradual Duration:  3 days Ineffective treatments:  OTC medications and inhaler Associated symptoms: headaches, myalgias, sinus pain and swollen glands   Associated symptoms: no wheezing     Past Medical History:  Diagnosis Date  . Asthma   . Hypertension   . Obesity     There are no active problems to display for this patient.   Past Surgical History:  Procedure Laterality Date  . BREAST SURGERY    . CHOLECYSTECTOMY    . COLONOSCOPY N/A 07/15/2014   Procedure: COLONOSCOPY;  Surgeon: Danie Binder, MD;  Location: [facility];  Service: Endoscopy;  Laterality: N/A;  9:30 AM - moved to 1:00 - Ginger to notify pt     OB History   No obstetric history on file.      Home Medications    Prior to Admission medications   Medication Sig Start Date End Date Taking? Authorizing Provider  albuterol (PROVENTIL HFA;VENTOLIN HFA) 108 (90 BASE) MCG/ACT inhaler Inhale 2 puffs into the lungs every 4 (four) hours as needed for wheezing.    Lily Kocher, PA-C  benzonatate (TESSALON) 100 MG capsule Take 1 capsule (100 mg total) by mouth every 8 (eight) hours. 08/23/18   Orpah Greek, MD  cyclobenzaprine (FLEXERIL) 5 MG tablet Take 1 tablet (5 mg total) by mouth 3  (three) times daily as needed for muscle spasms. 10/30/15   Rolland Porter, MD  hydrochlorothiazide 25 MG tablet Take 25 mg by mouth daily.      [provider]  methocarbamol (ROBAXIN) 500 MG tablet Take 1 tablet (500 mg total) by mouth 2 (two) times daily. Patient not taking: Reported on 08/20/2017 01/01/16   Etta Quill, NP  naproxen (NAPROSYN) 500 MG tablet Take 1 tablet (500 mg total) by mouth 2 (two) times daily. Patient taking differently: Take 500 mg by mouth daily as needed for mild pain.  01/01/16   Etta Quill, NP  oseltamivir (TAMIFLU) 75 MG capsule Take 1 capsule (75 mg total) by mouth every 12 (twelve) hours. 08/23/18   Orpah Greek, MD  predniSONE (DELTASONE) 20 MG tablet Take 2 tablets (40 mg total) by mouth daily with breakfast. 08/23/18   Sharilynn Cassity, Gwenyth Allegra, MD  traMADol (ULTRAM) 50 MG tablet Take 1 tablet (50 mg total) by mouth every 6 (six) hours as needed. Patient taking differently: Take 50 mg by mouth every 6 (six) hours as needed for moderate pain.  06/17/14   Dorie Rank, MD    Family History History reviewed. No pertinent family history.  Social History Social History   Tobacco Use  . Smoking status: Never Smoker  .  Smokeless tobacco: Never Used  Substance Use Topics  . Alcohol use: No  . Drug use: No     Allergies   Patient has no known allergies.   Review of Systems Review of Systems  Constitutional: Positive for fever.  HENT: Positive for congestion, ear pain, sinus pain and sore throat.   Respiratory: Positive for cough. Negative for wheezing.   Musculoskeletal: Positive for myalgias.  Neurological: Positive for headaches.  All other systems reviewed and are negative.    Physical Exam Updated Vital Signs BP (!) 139/125 (BP Location: Right Arm)   Pulse (!) 106   Temp 99.2 F (37.3 C) (Oral)   Resp 20   SpO2 96%   Physical Exam Vitals signs and nursing note reviewed.  Constitutional:      General: She is not in acute  distress.    Appearance: Normal appearance. She is well-developed.  HENT:     Head: Normocephalic and atraumatic.     Right Ear: Hearing normal.     Left Ear: Hearing normal.     Nose: Nose normal.  Eyes:     Conjunctiva/sclera: Conjunctivae normal.     Pupils: Pupils are equal, round, and reactive to light.  Neck:     Musculoskeletal: Normal range of motion and neck supple.  Cardiovascular:     Rate and Rhythm: Regular rhythm.     Heart sounds: S1 normal and S2 normal. No murmur. No friction rub. No gallop.   Pulmonary:     Effort: Pulmonary effort is normal. No respiratory distress.     Breath sounds: Normal breath sounds.  Chest:     Chest wall: No tenderness.  Abdominal:     General: Bowel sounds are normal.     Palpations: Abdomen is soft.     Tenderness: There is no abdominal tenderness. There is no guarding or rebound. Negative signs include Murphy's sign and McBurney's sign.     Hernia: No hernia is present.  Musculoskeletal: Normal range of motion.  Skin:    General: Skin is warm and dry.     Findings: No rash.  Neurological:     Mental Status: She is alert and oriented to person, place, and time.     GCS: GCS eye subscore is 4. GCS verbal subscore is 5. GCS motor subscore is 6.     Cranial Nerves: No cranial nerve deficit.     Sensory: No sensory deficit.     Coordination: Coordination normal.  Psychiatric:        Speech: Speech normal.        Behavior: Behavior normal.        Thought Content: Thought content normal.      ED Treatments / Results  Labs (all labs ordered are listed, but only abnormal results are displayed) Labs Reviewed - No data to display  EKG None  Radiology No results found.  Procedures Procedures (including critical care time)  Medications Ordered in ED Medications  oseltamivir (TAMIFLU) capsule 75 mg (has no administration in time range)  acetaminophen (TYLENOL) tablet 1,000 mg (has no administration in time range)  ibuprofen  (ADVIL,MOTRIN) tablet 800 mg (has no administration in time range)     Initial Impression / Assessment and Plan / ED Course  I have reviewed the triage vital signs and the nursing notes.  Pertinent labs & imaging results that were available during my care of the patient were reviewed by me and considered in my medical decision making (see chart for details).  Patient presents to the emergency department for 3 days of nasal congestion, sinus congestion, cough, myalgias.  Symptoms consistent with influenza, currently endemic in the community.  Lungs are clear, no concern for pneumonia.  She does have asthma but is not currently wheezing, has an albuterol inhaler.  Will treat symptomatically and with Tamiflu.  Final Clinical Impressions(s) / ED Diagnoses   Final diagnoses:  Influenza    ED Discharge Orders         Ordered    oseltamivir (TAMIFLU) 75 MG capsule  Every 12 hours     08/23/18 0023    predniSONE (DELTASONE) 20 MG tablet  Daily with breakfast     08/23/18 0023    benzonatate (TESSALON) 100 MG capsule  Every 8 hours     08/23/18 0023           Orpah Greek, MD 08/23/18 [phone]

## 2018-09-03 ENCOUNTER — Ambulatory Visit (HOSPITAL_COMMUNITY)
Admission: RE | Admit: 2018-09-03 | Discharge: 2018-09-03 | Disposition: A | Discharge status: Home / Self Care | Payer: Self-pay | Source: Ambulatory Visit | Attending: Internal Medicine | Admitting: Internal Medicine

## 2018-09-03 ENCOUNTER — Other Ambulatory Visit (HOSPITAL_COMMUNITY): Payer: Self-pay | Admitting: Internal Medicine

## 2018-09-03 DIAGNOSIS — J209 Acute bronchitis, unspecified: Secondary | ICD-10-CM

## 2019-07-07 ENCOUNTER — Emergency Department (HOSPITAL_COMMUNITY): Payer: Self-pay

## 2019-07-07 ENCOUNTER — Other Ambulatory Visit: Payer: Self-pay

## 2019-07-07 ENCOUNTER — Encounter (HOSPITAL_COMMUNITY): Payer: Self-pay | Admitting: Emergency Medicine

## 2019-07-07 ENCOUNTER — Emergency Department (HOSPITAL_COMMUNITY)
Admission: EM | Admit: 2019-07-07 | Discharge: 2019-07-07 | Disposition: A | Discharge status: Home / Self Care | Payer: Self-pay | Attending: Emergency Medicine | Admitting: Emergency Medicine

## 2019-07-07 DIAGNOSIS — Z7982 Long term (current) use of aspirin: Secondary | ICD-10-CM | POA: Insufficient documentation

## 2019-07-07 DIAGNOSIS — I10 Essential (primary) hypertension: Secondary | ICD-10-CM | POA: Insufficient documentation

## 2019-07-07 DIAGNOSIS — Z79899 Other long term (current) drug therapy: Secondary | ICD-10-CM | POA: Insufficient documentation

## 2019-07-07 DIAGNOSIS — U071 COVID-19: Secondary | ICD-10-CM | POA: Insufficient documentation

## 2019-07-07 DIAGNOSIS — J45909 Unspecified asthma, uncomplicated: Secondary | ICD-10-CM | POA: Insufficient documentation

## 2019-07-07 LAB — COMPREHENSIVE METABOLIC PANEL
ALT: 33 U/L (ref 0–44)
AST: 42 U/L — ABNORMAL HIGH (ref 15–41)
Albumin: 3.6 g/dL (ref 3.5–5.0)
Alkaline Phosphatase: 60 U/L (ref 38–126)
Anion gap: 14 (ref 5–15)
BUN: 14 mg/dL (ref 6–20)
CO2: 27 mmol/L (ref 22–32)
Calcium: 8.6 mg/dL — ABNORMAL LOW (ref 8.9–10.3)
Chloride: 95 mmol/L — ABNORMAL LOW (ref 98–111)
Creatinine, Ser: 0.93 mg/dL (ref 0.44–1.00)
GFR calc Af Amer: >60 mL/min (ref >60)
GFR calc non Af Amer: >60 mL/min (ref >60)
Glucose, Bld: 111 mg/dL — ABNORMAL HIGH (ref 70–99)
Potassium: 3 mmol/L — ABNORMAL LOW (ref 3.5–5.1)
Sodium: 136 mmol/L (ref 135–145)
Total Bilirubin: 0.3 mg/dL (ref 0.3–1.2)
Total Protein: 7.9 g/dL (ref 6.5–8.1)

## 2019-07-07 LAB — URINALYSIS, ROUTINE W REFLEX MICROSCOPIC
Bilirubin Urine: NEGATIVE
Glucose, UA: NEGATIVE mg/dL
Ketones, ur: NEGATIVE mg/dL
Leukocytes,Ua: NEGATIVE
Nitrite: NEGATIVE
Protein, ur: NEGATIVE mg/dL
Specific Gravity, Urine: 1.02 (ref 1.005–1.030)
pH: 5 (ref 5.0–8.0)

## 2019-07-07 LAB — CBC WITH DIFFERENTIAL/PLATELET
Abs Immature Granulocytes: 0.01 10*3/uL (ref 0.00–0.07)
Basophils Absolute: 0 10*3/uL (ref 0.0–0.1)
Basophils Relative: 0 %
Eosinophils Absolute: 0 10*3/uL (ref 0.0–0.5)
Eosinophils Relative: 0 %
HCT: 42.3 % (ref 36.0–46.0)
Hemoglobin: 13.5 g/dL (ref 12.0–15.0)
Immature Granulocytes: 0 %
Lymphocytes Relative: 40 %
Lymphs Abs: 1.9 10*3/uL (ref 0.7–4.0)
MCH: 28.5 pg (ref 26.0–34.0)
MCHC: 31.9 g/dL (ref 30.0–36.0)
MCV: 89.4 fL (ref 80.0–100.0)
Monocytes Absolute: 0.4 10*3/uL (ref 0.1–1.0)
Monocytes Relative: 8 %
Neutro Abs: 2.5 10*3/uL (ref 1.7–7.7)
Neutrophils Relative %: 52 %
Platelets: 314 10*3/uL (ref 150–400)
RBC: 4.73 MIL/uL (ref 3.87–5.11)
RDW: 14.3 % (ref 11.5–15.5)
WBC: 4.9 10*3/uL (ref 4.0–10.5)
nRBC: 0 % (ref 0.0–0.2)

## 2019-07-07 LAB — TROPONIN I (HIGH SENSITIVITY): Troponin I (High Sensitivity): 3 ng/L (ref <18)

## 2019-07-07 LAB — POC SARS CORONAVIRUS 2 AG -  ED: SARS Coronavirus 2 Ag: POSITIVE — AB

## 2019-07-07 MED ORDER — SODIUM CHLORIDE 0.9 % IV BOLUS
500.0000 mL | Freq: Once | INTRAVENOUS | Status: AC
  Administered 2019-07-07: 16:00:00 500 mL via INTRAVENOUS

## 2019-07-07 MED ORDER — ONDANSETRON HCL 4 MG/2ML IJ SOLN
4.0000 mg | Freq: Once | INTRAMUSCULAR | Status: AC
  Administered 2019-07-07: 15:00:00 4 mg via INTRAVENOUS
  Filled 2019-07-07: qty 2

## 2019-07-07 MED ORDER — BENZONATATE 100 MG PO CAPS: 100.0000 mg | ORAL_CAPSULE | Freq: Three times a day (TID) | ORAL | 0 refills | Status: DC

## 2019-07-07 MED ORDER — SODIUM CHLORIDE 0.9 % IV BOLUS
500.0000 mL | Freq: Once | INTRAVENOUS | Status: AC
  Administered 2019-07-07: 15:00:00 500 mL via INTRAVENOUS

## 2019-07-07 MED ORDER — POTASSIUM CHLORIDE CRYS ER 20 MEQ PO TBCR
40.0000 meq | EXTENDED_RELEASE_TABLET | Freq: Once | ORAL | Status: AC
  Administered 2019-07-07: 17:00:00 40 meq via ORAL
  Filled 2019-07-07: qty 2

## 2019-07-07 MED ORDER — ALBUTEROL SULFATE HFA 108 (90 BASE) MCG/ACT IN AERS: 1.0000 | INHALATION_SPRAY | Freq: Four times a day (QID) | RESPIRATORY_TRACT | 0 refills | Status: DC | PRN

## 2019-07-07 MED ORDER — ONDANSETRON HCL 4 MG PO TABS: 4.0000 mg | ORAL_TABLET | Freq: Four times a day (QID) | ORAL | 0 refills | Status: DC

## 2019-07-07 NOTE — Discharge Instructions (Addendum)
You have tested positive for COVID-19.  It is important that you get plenty of rest and stay well-hydrated.  Continue alternating Tylenol and ibuprofen for your fever and body aches.  Take Tessalon every 8 hours as needed for cough.  Take Zofran every 6 hours as needed for nausea or vomiting.  Use albuterol inhaler every 6 hours as needed for wheezing or shortness of breath.  Begin with a clear liquid diet and progress to bland foods such as bananas, rice, applesauce, toast.  Please return the emergency department if you develop any worsening symptoms including worsening shortness of breath and feeling like you are running a marathon while sitting down; intractable vomiting, severe, localized abdominal pain, or any other concerning symptoms.  You will need to quarantine at home for 10 days and have no fever and improving symptoms for 3 days without medicine.  Your urine did have some blood in it, this may be typical for you or related to dehydration.  Please follow-up with your doctor for recheck of this if this is a new finding.

## 2019-07-07 NOTE — ED Notes (Signed)
RN Daphney ambulated pt, pt ambulated well O2 dropped to 91% and HR was 113

## 2019-07-07 NOTE — ED Notes (Signed)
Pt instructed to collect urine.  Bedside commode in room.

## 2019-07-07 NOTE — ED Notes (Signed)
Sats 91-95% during ambulation.  Pt did c/o dizziness after walking.

## 2019-07-07 NOTE — ED Provider Notes (Signed)
[facility] EMERGENCY DEPARTMENT Provider Note   CSN: [id number] Arrival date & time: 07/07/19  1348     History   Chief Complaint Chief Complaint  Patient presents with  . Cough    HPI Allison [name] is a 53 y.o. female with history of hypertension, asthma, obesity who presents with a 4-day history of headache, fever, cough, nausea, vomiting, diarrhea, chest pain, shortness of breath.  She has been around her daughter who tested positive for COVID-19.  Her fever has been up to 102.  She has been taking naproxen and Tylenol.  She has had some generalized soreness in her abdomen.  She has had generalized body aches.  She denies any urinary symptoms.  She is very exhausted when she walks.     HPI  Past Medical History:  Diagnosis Date  . Asthma   . Hypertension   . Obesity     There are no active problems to display for this patient.   Past Surgical History:  Procedure Laterality Date  . BREAST SURGERY    . CHOLECYSTECTOMY    . COLONOSCOPY N/A 07/15/2014   Procedure: COLONOSCOPY;  Surgeon: Danie Binder, MD;  Location: [facility];  Service: Endoscopy;  Laterality: N/A;  9:30 AM - moved to 1:00 - Ginger to notify pt     OB History    Gravida  3   Para  3   Term  3   Preterm      AB      Living  3     SAB      TAB      Ectopic      Multiple      Live Births               Home Medications    Prior to Admission medications   Medication Sig Start Date End Date Taking? Authorizing Provider  acetaminophen (TYLENOL) 500 MG tablet Take 500 mg by mouth every 6 (six) hours as needed for mild pain or moderate pain.   Yes [provider]  albuterol (PROVENTIL HFA;VENTOLIN HFA) 108 (90 BASE) MCG/ACT inhaler Inhale 2 puffs into the lungs every 4 (four) hours as needed for wheezing.   Yes Lily Kocher, PA-C  aspirin EC 81 MG tablet Take 81 mg by mouth daily.   Yes [provider]  hydrochlorothiazide 25 MG tablet Take 25 mg by mouth  daily.     Yes [provider]  naproxen (NAPROSYN) 500 MG tablet Take 1 tablet (500 mg total) by mouth 2 (two) times daily. Patient taking differently: Take 500 mg by mouth daily as needed for mild pain.  01/01/16  Yes Etta Quill, NP  naproxen sodium (ALEVE) 220 MG tablet Take 220 mg by mouth daily as needed (for pain).   Yes [provider]  albuterol (VENTOLIN HFA) 108 (90 Base) MCG/ACT inhaler Inhale 1-2 puffs into the lungs every 6 (six) hours as needed for wheezing or shortness of breath. 07/07/19   Shanvi Moyd, Bea Graff, PA-C  benzonatate (TESSALON) 100 MG capsule Take 1 capsule (100 mg total) by mouth every 8 (eight) hours. 07/07/19   Garey Alleva, Bea Graff, PA-C  ondansetron (ZOFRAN) 4 MG tablet Take 1 tablet (4 mg total) by mouth every 6 (six) hours. 07/07/19   Frederica Kuster, PA-C    Family History No family history on file.  Social History Social History   Tobacco Use  . Smoking status: Never Smoker  . Smokeless tobacco:  Never Used  Substance Use Topics  . Alcohol use: No  . Drug use: No     Allergies   Patient has no known allergies.   Review of Systems Review of Systems  Constitutional: Positive for chills and fever.  HENT: Positive for sore throat. Negative for facial swelling.   Respiratory: Positive for cough and shortness of breath.   Cardiovascular: Positive for chest pain.  Gastrointestinal: Positive for abdominal pain, diarrhea, nausea and vomiting. Negative for blood in stool.  Genitourinary: Negative for dysuria.  Musculoskeletal: Positive for myalgias and neck pain. Negative for back pain.  Skin: Negative for rash and wound.  Neurological: Positive for headaches.  Psychiatric/Behavioral: The patient is not nervous/anxious.      Physical Exam Updated Vital Signs BP 106/82   Pulse 99   Temp 98 F (36.7 C) (Oral)   Resp (!) 23   Ht 5\' 1"  (1.549 m)   Wt 111.1 kg   LMP 12/09/2015   SpO2 94%   BMI 46.29 kg/m   Physical Exam Vitals  signs and nursing note reviewed.  Constitutional:      General: She is not in acute distress.    Appearance: She is well-developed. She is not diaphoretic.  HENT:     Head: Normocephalic and atraumatic.     Mouth/Throat:     Pharynx: Posterior oropharyngeal erythema present. No oropharyngeal exudate.  Eyes:     General: No scleral icterus.       Right eye: No discharge.        Left eye: No discharge.     Conjunctiva/sclera: Conjunctivae normal.     Pupils: Pupils are equal, round, and reactive to light.  Neck:     Musculoskeletal: Normal range of motion and neck supple.     Thyroid: No thyromegaly.  Cardiovascular:     Rate and Rhythm: Normal rate and regular rhythm.     Heart sounds: Normal heart sounds. No murmur. No friction rub. No gallop.   Pulmonary:     Effort: Pulmonary effort is normal. Tachypnea present. No respiratory distress.     Breath sounds: Normal breath sounds. No stridor. No wheezing or rales.  Abdominal:     General: Bowel sounds are normal. There is no distension.     Palpations: Abdomen is soft.     Tenderness: There is no abdominal tenderness. There is no guarding or rebound.  Lymphadenopathy:     Cervical: No cervical adenopathy.  Skin:    General: Skin is warm and dry.     Coloration: Skin is not pale.     Findings: No rash.  Neurological:     Mental Status: She is alert.     Coordination: Coordination normal.      ED Treatments / Results  Labs (all labs ordered are listed, but only abnormal results are displayed) Labs Reviewed  COMPREHENSIVE METABOLIC PANEL - Abnormal; Notable for the following components:      Result Value   Potassium 3.0 (*)    Chloride 95 (*)    Glucose, Bld 111 (*)    Calcium 8.6 (*)    AST 42 (*)    All other components within normal limits  URINALYSIS, ROUTINE W REFLEX MICROSCOPIC - Abnormal; Notable for the following components:   APPearance HAZY (*)    Hgb urine dipstick MODERATE (*)    Bacteria, UA FEW (*)     All other components within normal limits  POC SARS CORONAVIRUS 2 AG -  ED -  Abnormal; Notable for the following components:   SARS Coronavirus 2 Ag POSITIVE (*)    All other components within normal limits  CBC WITH DIFFERENTIAL/PLATELET  TROPONIN I (HIGH SENSITIVITY)  TROPONIN I (HIGH SENSITIVITY)    EKG None  Radiology Dg Chest Portable 1 View  Result Date: 07/07/2019 CLINICAL DATA:  Cough, fever and shortness of breath. EXAM: PORTABLE CHEST 1 VIEW COMPARISON:  09/03/2018 FINDINGS: The cardiac silhouette, mediastinal and hilar contours are within normal limits and stable. The lungs are clear. No pleural effusions. No pulmonary lesions. The bony thorax is intact. IMPRESSION: No acute cardiopulmonary findings. Electronically Signed   By: Marijo Sanes M.D.   On: 07/07/2019 15:06    Procedures Procedures (including critical care time)  Medications Ordered in ED Medications  sodium chloride 0.9 % bolus 500 mL (0 mLs Intravenous Stopped 07/07/19 1532)  ondansetron (ZOFRAN) injection 4 mg (4 mg Intravenous Given 07/07/19 1501)  sodium chloride 0.9 % bolus 500 mL (0 mLs Intravenous Stopped 07/07/19 1650)  potassium chloride SA (KLOR-CON) CR tablet 40 mEq (40 mEq Oral Given 07/07/19 1648)     Initial Impression / Assessment and Plan / ED Course  I have reviewed the triage vital signs and the nursing notes.  Pertinent labs & imaging results that were available during my care of the patient were reviewed by me and considered in my medical decision making (see chart for details).  Clinical Course as of Jul 06 1699  Sun Jul 07, 2019  1654 Urinalysis, Routine w reflex microscopicMarland Kitchen [AL]    Clinical Course User Index [AL] Frederica Kuster, PA-C       Patient found to have COVID-19 infection.  She is mildly hypokalemic, which was replaced in the ED.  Otherwise labs are unremarkable.  She does have moderate hematuria, which she has had in the past.  Advised to recheck this with PCP when  feeling better.  Chest x-ray is clear.  Patient's tachycardia improved after 1 L of fluids.  She ambulated with pulse ox at 95%.  She is feeling much improved.  Will discharge home with Tessalon, Zofran, albuterol inhaler, continue ibuprofen, Tylenol.  Patient given low threshold to return if shortness of breath or any other symptoms are worsening.  Encouraged rest and fluids.  Patient understands and agrees with plan.  Patient vitals stable and discharged in satisfactory condition.  Final Clinical Impressions(s) / ED Diagnoses   Final diagnoses:  COVID-19 virus infection    ED Discharge Orders         Ordered    benzonatate (TESSALON) 100 MG capsule  Every 8 hours     07/07/19 1650    ondansetron (ZOFRAN) 4 MG tablet  Every 6 hours     07/07/19 1650    albuterol (VENTOLIN HFA) 108 (90 Base) MCG/ACT inhaler  Every 6 hours PRN     07/07/19 [address] [place], PA-C 07/07/19 1700    Milton Ferguson, MD 07/12/19 [phone]

## 2019-07-07 NOTE — ED Triage Notes (Signed)
Patient c/o headache, cough, fevers, fatigue, nausea, and vomiting since Tuesday. Patient states daughter diagnosed with Covid yesterday and she lives in house with daughter.

## 2019-10-10 ENCOUNTER — Ambulatory Visit: Payer: Self-pay | Attending: Internal Medicine

## 2019-10-24 ENCOUNTER — Ambulatory Visit: Payer: Self-pay | Attending: Internal Medicine

## 2019-10-24 DIAGNOSIS — Z23 Encounter for immunization: Secondary | ICD-10-CM

## 2019-10-24 NOTE — Progress Notes (Signed)
   Covid-19 Vaccination Clinic  Name:  Allison [name]    MRN: [id number] DOB: November 24, 1965  10/24/2019  [name] was observed post Covid-19 immunization for 15 minutes without incident. She was provided with Vaccine Information Sheet and instruction to access the V-Safe system.   [name] was instructed to call 911 with any severe reactions post vaccine: Marland Kitchen Difficulty breathing  . Swelling of face and throat  . A fast heartbeat  . A bad rash all over body  . Dizziness and weakness   Immunizations Administered    Name Date Dose VIS Date Route   Moderna COVID-19 Vaccine 10/24/2019 11:59 AM 0.5 mL 07/02/2019 Intramuscular   Manufacturer: Moderna   Lot: [id number]   [id number]

## 2019-11-26 ENCOUNTER — Ambulatory Visit: Payer: Self-pay | Attending: Internal Medicine

## 2019-11-26 DIAGNOSIS — Z23 Encounter for immunization: Secondary | ICD-10-CM

## 2019-11-26 NOTE — Progress Notes (Signed)
   Covid-19 Vaccination Clinic  Name:  Allison [name]    MRN: [id number] DOB: October 04, 1965  11/26/2019  Ms. [name] was observed post Covid-19 immunization for 15 minutes without incident. She was provided with Vaccine Information Sheet and instruction to access the V-Safe system.   Ms. [name] was instructed to call 911 with any severe reactions post vaccine: Marland Kitchen Difficulty breathing  . Swelling of face and throat  . A fast heartbeat  . A bad rash all over body  . Dizziness and weakness   Immunizations Administered    Name Date Dose VIS Date Route   Moderna COVID-19 Vaccine 11/26/2019 11:47 AM 0.5 mL 07/2019 Intramuscular   Manufacturer: Moderna   Lot: [id number]   [id number]

## 2019-12-12 ENCOUNTER — Other Ambulatory Visit: Payer: Self-pay

## 2019-12-12 ENCOUNTER — Emergency Department (HOSPITAL_COMMUNITY): Payer: No Typology Code available for payment source

## 2019-12-12 ENCOUNTER — Emergency Department (HOSPITAL_COMMUNITY)
Admission: EM | Admit: 2019-12-12 | Discharge: 2019-12-12 | Disposition: A | Discharge status: Home / Self Care | Payer: No Typology Code available for payment source | Attending: Emergency Medicine | Admitting: Emergency Medicine

## 2019-12-12 ENCOUNTER — Encounter (HOSPITAL_COMMUNITY): Payer: Self-pay | Admitting: *Deleted

## 2019-12-12 DIAGNOSIS — Z7982 Long term (current) use of aspirin: Secondary | ICD-10-CM | POA: Diagnosis not present

## 2019-12-12 DIAGNOSIS — Y93I9 Activity, other involving external motion: Secondary | ICD-10-CM | POA: Diagnosis not present

## 2019-12-12 DIAGNOSIS — J45909 Unspecified asthma, uncomplicated: Secondary | ICD-10-CM | POA: Diagnosis not present

## 2019-12-12 DIAGNOSIS — M7918 Myalgia, other site: Secondary | ICD-10-CM

## 2019-12-12 DIAGNOSIS — Z79899 Other long term (current) drug therapy: Secondary | ICD-10-CM | POA: Diagnosis not present

## 2019-12-12 DIAGNOSIS — I10 Essential (primary) hypertension: Secondary | ICD-10-CM | POA: Insufficient documentation

## 2019-12-12 DIAGNOSIS — Y999 Unspecified external cause status: Secondary | ICD-10-CM | POA: Diagnosis not present

## 2019-12-12 DIAGNOSIS — M542 Cervicalgia: Secondary | ICD-10-CM | POA: Diagnosis present

## 2019-12-12 DIAGNOSIS — Y92410 Unspecified street and highway as the place of occurrence of the external cause: Secondary | ICD-10-CM | POA: Diagnosis not present

## 2019-12-12 MED ORDER — CYCLOBENZAPRINE HCL 5 MG PO TABS: 5.0000 mg | ORAL_TABLET | Freq: Three times a day (TID) | ORAL | 0 refills | Status: DC | PRN

## 2019-12-12 MED ORDER — IBUPROFEN 600 MG PO TABS: 600.0000 mg | ORAL_TABLET | Freq: Four times a day (QID) | ORAL | 0 refills | Status: DC | PRN
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
## 2022-08-25 ENCOUNTER — Encounter: Payer: Self-pay | Admitting: Nurse Practitioner

## 2022-08-25 ENCOUNTER — Other Ambulatory Visit (HOSPITAL_COMMUNITY): Payer: Self-pay | Admitting: Nurse Practitioner

## 2022-08-25 ENCOUNTER — Other Ambulatory Visit: Payer: Self-pay | Admitting: Nurse Practitioner

## 2022-08-25 DIAGNOSIS — N852 Hypertrophy of uterus: Secondary | ICD-10-CM

## 2022-08-25 DIAGNOSIS — N950 Postmenopausal bleeding: Secondary | ICD-10-CM

## 2022-08-26 ENCOUNTER — Other Ambulatory Visit: Payer: Self-pay | Admitting: Nurse Practitioner

## 2022-08-26 DIAGNOSIS — Z1231 Encounter for screening mammogram for malignant neoplasm of breast: Secondary | ICD-10-CM

## 2022-08-29 ENCOUNTER — Telehealth: Payer: Self-pay

## 2022-08-29 NOTE — Telephone Encounter (Signed)
I tired calling patient about a referral that was received from [facility], no answer I did leave a voice mail for her to call me back. ep

## 2022-09-01 ENCOUNTER — Telehealth: Payer: Self-pay

## 2022-09-01 NOTE — Telephone Encounter (Signed)
I TIRED CALLING PT TO SCHEDULE APPOINTMENT FOR A REFERRAL THAT WAS RECEIVED. LVM FOR PT TO CALL THE OFFICE NO MY CHART SET UP. EP

## 2022-09-05 ENCOUNTER — Ambulatory Visit (HOSPITAL_COMMUNITY)
Admission: RE | Admit: 2022-09-05 | Discharge: 2022-09-05 | Disposition: A | Discharge status: Home / Self Care | Payer: Self-pay | Source: Ambulatory Visit | Attending: Nurse Practitioner | Admitting: Nurse Practitioner

## 2022-09-05 DIAGNOSIS — Z1231 Encounter for screening mammogram for malignant neoplasm of breast: Secondary | ICD-10-CM | POA: Insufficient documentation

## 2022-09-07 ENCOUNTER — Telehealth: Payer: Self-pay

## 2022-09-07 NOTE — Telephone Encounter (Signed)
Telephoned patient at mobile number. Left a voice message with [facility] contact information. 

## 2022-09-08 ENCOUNTER — Other Ambulatory Visit (HOSPITAL_COMMUNITY): Payer: Self-pay | Admitting: Obstetrics and Gynecology

## 2022-09-08 DIAGNOSIS — R928 Other abnormal and inconclusive findings on diagnostic imaging of breast: Secondary | ICD-10-CM

## 2022-09-20 ENCOUNTER — Encounter: Payer: Self-pay | Admitting: Adult Health

## 2022-09-20 ENCOUNTER — Ambulatory Visit (INDEPENDENT_AMBULATORY_CARE_PROVIDER_SITE_OTHER): Payer: 59 | Admitting: Adult Health

## 2022-09-20 VITALS — BP 137/88 | HR 88 | Ht 62.0 in | Wt 255.0 lb

## 2022-09-20 DIAGNOSIS — N950 Postmenopausal bleeding: Secondary | ICD-10-CM | POA: Diagnosis not present

## 2022-09-20 NOTE — Progress Notes (Signed)
  Subjective:     Patient ID: Allison [name], female   DOB: 03/18/1966, 57 y.o.   MRN: [id number]  HPI Allison [name] is a 57 year old black female,single, PM, referred by Karma Ganja NP for PMB. Had not a period since 2017, and started bleeding every month since June 2023.   Last pap was 08/25/22 negative HPV and [facility]  PCP is Dr Legrand Rams.  Review of Systems PMB since June 2023 every month. Denies any problesm with urination but has constipation with metformin. Reviewed past medical,surgical, social and family history. Reviewed medications and allergies.     Objective:   Physical Exam BP 137/88 (BP Location: Right Arm, Patient Position: Sitting, Cuff Size: Large)   Pulse 88   Ht 5' 2"$  (1.575 m)   Wt 255 lb (115.7 kg)   LMP 12/09/2015   BMI 46.64 kg/m     Skin warm and dry.Pelvic: external genitalia is normal in appearance no lesions, vagina: pale,urethra has no lesions or masses noted, cervix:smooth and bulbous,+blood at os, uterus: normal size, shape and contour, non tender, no masses felt, adnexa: no masses or tenderness noted. Bladder is non tender and no masses felt.  AA is 0 Fall risk is low    09/20/2022   10:20 AM  Depression screen PHQ 2/9  Decreased Interest 0  Down, Depressed, Hopeless 1  PHQ - 2 Score 1  Altered sleeping 3  Tired, decreased energy 3  Change in appetite 3  Feeling bad or failure about yourself  3  Trouble concentrating 3  Moving slowly or fidgety/restless 2  Suicidal thoughts 0  PHQ-9 Score 18       09/20/2022   10:21 AM  GAD 7 : Generalized Anxiety Score  Nervous, Anxious, on Edge 0  Control/stop worrying 2  Worry too much - different things 2  Trouble relaxing 2  Restless 0  Easily annoyed or irritable 1  Afraid - awful might happen 0  Total GAD 7 Score 7      [facility] - 09/20/22 1018       Pregnancy Intention Screening   Does the patient want to become pregnant in the next year? N/A    Does the patient's partner want to  become pregnant in the next year? N/A    Would the patient like to discuss contraceptive options today? N/A      Contraception Wrap Up   Current Method Abstinence;No Method - Other Reason   postmenopausal   Reason for No Current Contraceptive Method at Intake ([facility] Only) Other    End Method Abstinence;No Method - Other Reason   postmenopausal   Contraception Counseling Provided No            Examination chaperoned by Diona Fanti CMA. Assessment:     1. PMB (postmenopausal bleeding) +PMB monthly since 12/2021   Review handout on PMB Discussed bleeding with her and need for pelvic US will get 09/29/22 in our office She is aware that if endometrium thickened will need biopsy to rule out cervical cancer  Plan:     Return 10/03/22 to discuss results of [place] with me Go to DSS and apply for [facility]

## 2022-09-23 ENCOUNTER — Inpatient Hospital Stay: Payer: Self-pay | Attending: Obstetrics and Gynecology | Admitting: Hematology and Oncology

## 2022-09-23 VITALS — Wt 252.6 lb

## 2022-09-23 DIAGNOSIS — R928 Other abnormal and inconclusive findings on diagnostic imaging of breast: Secondary | ICD-10-CM

## 2022-09-23 NOTE — Progress Notes (Signed)
Allison [name] is a 57 y.o. female who presents to [facility] clinic today with no complaints. Call back from screening mammogram for possible right breast distortion.    Pap Smear: Pap not smear completed today. Last Pap smear was 2024 at [facility] clinic and was normal. Per patient has no history of an abnormal Pap smear. Last Pap smear result is not available in Epic.   Physical exam: Breasts Breasts symmetrical. No skin abnormalities bilateral breasts. No nipple retraction bilateral breasts. No nipple discharge bilateral breasts. No lymphadenopathy. No lumps palpated bilateral breasts.  MS DIGITAL SCREENING TOMO BILATERAL  Result Date: 09/06/2022 CLINICAL DATA:  Screening. EXAM: DIGITAL SCREENING BILATERAL MAMMOGRAM WITH TOMOSYNTHESIS AND CAD TECHNIQUE: Bilateral screening digital craniocaudal and mediolateral oblique mammograms were obtained. Bilateral screening digital breast tomosynthesis was performed. The images were evaluated with computer-aided detection. COMPARISON:  Previous exam(s). ACR Breast Density Category a: The breast tissue is almost entirely fatty. FINDINGS: In the right breast, possible distortion warrants further evaluation. In the left breast, no findings suspicious for malignancy. IMPRESSION: Further evaluation is suggested for possible distortion in the right breast. RECOMMENDATION: Diagnostic mammogram and possibly ultrasound of the right breast. (Code:FI-R-44M) The patient will be contacted regarding the findings, and additional imaging will be scheduled. BI-RADS CATEGORY  0: Incomplete. Need additional imaging evaluation and/or prior mammograms for comparison. Electronically Signed   By: Marin Olp M.D.   On: 09/06/2022 16:41         Pelvic/Bimanual Pap is not indicated today    Smoking History: Patient has never smoked and was not referred to quit line.    Patient Navigation: Patient education provided. Access to services provided for patient through [facility] program. No  interpreter provided. No transportation provided   Colorectal Cancer Screening: Per patient has had colonoscopy completed on 2015 with polyps, cecal, ascending and transverse- Fragments of tubular adenoma, no high grade dysplasia or malignancy identified.  No complaints today.    Breast and Cervical Cancer Risk Assessment: Patient does not have family history of breast cancer, known genetic mutations, or radiation treatment to the chest before age 52. Patient does not have history of cervical dysplasia, immunocompromised, or DES exposure in-utero.  Risk Assessment   No risk assessment data     A: [facility] exam without pap smear No complaints with benign exam. Possible right breast distortion on screening mammogram.   P: [facility]. Appointment scheduled 09/27/22.  Melodye Ped, NP 09/23/2022 9:59 AM

## 2022-09-23 NOTE — Patient Instructions (Addendum)
Taught Allison [name] about self breast awareness and gave educational materials to take home. Patient did not need a Pap smear today due to last Pap smear was in 2024 per patient.  Let her know [facility] will cover Pap smears every 5 years unless has a history of abnormal Pap smears. Referred [facility] to the [facility] for diagnostic mammogram. Appointment scheduled for 09/23/22. Patient aware of appointment and will be there. Let patient know will follow up with her within the next couple weeks with results. Allison [name] verbalized understanding.  Melodye Ped, NP 10:03 AM

## 2022-09-27 ENCOUNTER — Ambulatory Visit (HOSPITAL_COMMUNITY)
Admission: RE | Admit: 2022-09-27 | Discharge: 2022-09-27 | Disposition: A | Discharge status: Home / Self Care | Payer: Self-pay | Source: Ambulatory Visit | Attending: Obstetrics and Gynecology | Admitting: Obstetrics and Gynecology

## 2022-09-27 ENCOUNTER — Encounter (HOSPITAL_COMMUNITY): Payer: Self-pay

## 2022-09-27 DIAGNOSIS — R928 Other abnormal and inconclusive findings on diagnostic imaging of breast: Secondary | ICD-10-CM | POA: Insufficient documentation

## 2022-09-29 ENCOUNTER — Ambulatory Visit (INDEPENDENT_AMBULATORY_CARE_PROVIDER_SITE_OTHER): Payer: 59

## 2022-09-29 DIAGNOSIS — N950 Postmenopausal bleeding: Secondary | ICD-10-CM | POA: Diagnosis not present

## 2022-09-29 NOTE — Progress Notes (Signed)
PELVIC US TA/TV: homogeneous anteverted uterus,WNL,multiple nabothian cysts,complex thickened endometrium, EEC 18 mm, hypervascular echogenic mass within the endometrium 1.9 x 1.3 cm (? Endometrial polyp),normal ovaries,ovaries appear mobile,no free fluid   Chaperone Tish

## 2022-10-03 ENCOUNTER — Encounter: Payer: Self-pay | Admitting: Adult Health

## 2022-10-03 ENCOUNTER — Ambulatory Visit (INDEPENDENT_AMBULATORY_CARE_PROVIDER_SITE_OTHER): Payer: 59 | Admitting: Adult Health

## 2022-10-03 VITALS — BP 135/85 | HR 80 | Ht 62.0 in | Wt 258.0 lb

## 2022-10-03 DIAGNOSIS — N950 Postmenopausal bleeding: Secondary | ICD-10-CM

## 2022-10-03 DIAGNOSIS — R9389 Abnormal findings on diagnostic imaging of other specified body structures: Secondary | ICD-10-CM | POA: Insufficient documentation

## 2022-10-03 NOTE — Progress Notes (Signed)
  Subjective:     Patient ID: Allison [name], female   DOB: October 29, 1965, 57 y.o.   MRN: [id number]  HPI Allison [name] is a 57 year old black female,single, PM, in to discuss [place]. She has had vaginal bleeding every month since June 2023. Had not had period since 2017.  Last pap was negative HPV,NILM at [facility]  Review of Systems +PMB Reviewed past medical,surgical, social and family history. Reviewed medications and allergies.     Objective:   Physical Exam BP 135/85 (BP Location: Left Arm, Patient Position: Sitting, Cuff Size: Large)   Pulse 80   Ht '5\' 2"'$  (1.575 m)   Wt 258 lb (117 kg)   LMP 12/09/2015   BMI 47.19 kg/m  Skin warm and dry.  Lungs: clear to ausculation bilaterally. Cardiovascular: regular rate and rhythm.  Reviewed [place] with pt.: Uterus                      9.2 x 5.3 x 6.7 cm, Total uterine volume 169 cc, homogeneous anteverted uterus,WNL,multiple nabothian cysts   Endometrium          18 mm, symmetrical, complex thickened endometrium hypervascular echogenic mass within the endometrium 1.9 x 1.3 cm (? endometrial polyp)   Right ovary             2.7 x 2 x 2.5 cm, normal   Left ovary                2.5 x 3.2 x 2.2 cm, normal    No free fluid    Technician Comments:   PELVIC US TA/TV: homogeneous anteverted uterus,WNL,multiple nabothian cysts,complex thickened endometrium, EEC 18 mm, hypervascular echogenic mass within the endometrium 1.9 x 1.3 cm (? endometrial polyp),normal ovaries,ovaries appear mobile,no free fluid   Normal uterine size and shape Thickened endometrium with echogenic mass 1.9 x 1.3 cm- possible endometrial polyp Normal ovaries bilaterally   Recommendation further evaluation of endometrium, would consider sonohysterography and endometrial biopsy.    Allison Pupa, DO   [facility] - 10/03/22 1044       Pregnancy Intention Screening   Does the patient want to become pregnant in the next year? N/A    Does the patient's partner want to become pregnant  in the next year? N/A    Would the patient like to discuss contraceptive options today? N/A      Contraception Wrap Up   Current Method No Method - Other Reason   postmenopausal   Reason for No Current Contraceptive Method at Intake ([facility] Only) Other    End Method No Method - Other Reason   postmenopausal   Contraception Counseling Provided No                Assessment:     1. PMB (postmenopausal bleeding)  2. Thickened endometrium Will get [facility] and endometrial biopsy She is aware if biopsy +cancer will need hysterectomy     Plan:    Review handouts on [facility] and endometrail biopsy  Return  11/02/22 at 10:15 am for Sonohysterogram and endometrial biopsy with Dr Nelda Marseille

## 2022-11-01 ENCOUNTER — Other Ambulatory Visit: Payer: Self-pay | Admitting: Obstetrics & Gynecology

## 2022-11-01 DIAGNOSIS — N950 Postmenopausal bleeding: Secondary | ICD-10-CM

## 2022-11-02 ENCOUNTER — Other Ambulatory Visit: Payer: Medicaid Other | Admitting: Obstetrics & Gynecology

## 2022-11-02 ENCOUNTER — Other Ambulatory Visit: Payer: Medicaid Other

## 2022-11-28 ENCOUNTER — Other Ambulatory Visit (HOSPITAL_COMMUNITY)
Admission: RE | Admit: 2022-11-28 | Discharge: 2022-11-28 | Disposition: A | Discharge status: Home / Self Care | Payer: Medicaid Other | Source: Ambulatory Visit | Attending: Obstetrics & Gynecology | Admitting: Obstetrics & Gynecology

## 2022-11-28 ENCOUNTER — Ambulatory Visit (INDEPENDENT_AMBULATORY_CARE_PROVIDER_SITE_OTHER): Payer: Medicaid Other | Admitting: Obstetrics & Gynecology

## 2022-11-28 ENCOUNTER — Ambulatory Visit (INDEPENDENT_AMBULATORY_CARE_PROVIDER_SITE_OTHER): Payer: Medicaid Other

## 2022-11-28 ENCOUNTER — Encounter: Payer: Self-pay | Admitting: Obstetrics & Gynecology

## 2022-11-28 VITALS — BP 139/89 | HR 84

## 2022-11-28 DIAGNOSIS — N950 Postmenopausal bleeding: Secondary | ICD-10-CM

## 2022-11-28 DIAGNOSIS — R9389 Abnormal findings on diagnostic imaging of other specified body structures: Secondary | ICD-10-CM | POA: Insufficient documentation

## 2022-11-28 MED ORDER — NORETHINDRONE ACETATE 5 MG PO TABS: 10.0000 mg | ORAL_TABLET | Freq: Every day | ORAL | 6 refills | Status: DC

## 2022-11-28 NOTE — Progress Notes (Signed)
GYN VISIT Patient name: Allison [name] MRN [id number]  Date of birth: 29-Jul-1966 Chief Complaint:   Follow-up  History of Present Illness:   Allison [name] is a 57 y.o. [phone] PM female being seen today for the following concerns:     Bleeding has gone on since Dec 2023- at least weekly if not daily.  Sometimes it will stop for a few days then return.  Using about 3 big pads per day with BRB.  Denies pelvic pain.  Notes occasional left sided pain when she needs to have a BM.  Some constipation though typically goes 1-2x per day.  Denies diarrhea.  No urinary concerns.  No other acute concerns or complaints  Patient's last menstrual period was 12/09/2015.     09/20/2022   10:20 AM  Depression screen PHQ 2/9  Decreased Interest 0  Down, Depressed, Hopeless 1  PHQ - 2 Score 1  Altered sleeping 3  Tired, decreased energy 3  Change in appetite 3  Feeling bad or failure about yourself  3  Trouble concentrating 3  Moving slowly or fidgety/restless 2  Suicidal thoughts 0  PHQ-9 Score 18     Review of Systems:   Pertinent items are noted in HPI Denies fever/chills, dizziness, headaches, visual disturbances, fatigue, shortness of breath, chest pain, abdominal pain, vomiting no problems with urination, or intercourse unless otherwise stated above.  Pertinent History Reviewed:  Reviewed past medical,surgical, social, obstetrical and family history.  Reviewed problem list, medications and allergies. Physical Assessment:   Vitals:   11/28/22 1601  BP: 139/89  Pulse: 84  There is no height or weight on file to calculate BMI.       Physical Examination:   General appearance: alert, well appearing, and in no distress  Psych: mood appropriate, normal affect  Skin: warm & dry   Cardiovascular: normal heart rate noted  Respiratory: normal respiratory effort, no distress  Abdomen: obese, soft, non-tender,  Pelvic: VULVA: normal appearing vulva with no masses, tenderness or  lesions, VAGINA: normal appearing vagina with normal color and discharge, no lesions, CERVIX: normal appearing cervix, dark blood noted at cervical os, no cervical lesions appreciated UTERUS: uterus is normal size, shape, consistency and nontender, ADNEXA: normal adnexa in size, nontender and no masses  Extremities: no edema   Chaperone: Faith Rogue    SHG completed, see ultrasound note Following SHG considerable bright red blood noted   Prelim [place]: 3.3 x 1.7 x 2.8 cm vascular heterogeneous endometrial mass with irregular   Endometrial Biopsy Procedure Note  Pre-operative Diagnosis: PMB, Thickened endometrium  Post-operative Diagnosis: same  Procedure Details  The risks (including infection, bleeding, pain, and uterine perforation) and benefits of the procedure were explained to the patient and Written informed consent was obtained.  Antibiotic prophylaxis against endocarditis was not indicated.   The patient was placed in the dorsal lithotomy position.  Bimanual exam showed the uterus to be in the neutral position.  A speculum inserted in the vagina, and the cervix prepped with betadine.     A single tooth tenaculum was applied to the anterior lip of the cervix for stabilization.  Os finder was used.  A Pipelle endometrial aspirator was used to sample the endometrium.  Sample was sent for pathologic examination.  Condition: Stable  Complications: None  Assessment & Plan:  1) PMB, Thickened endometrium -Based on ultrasound and and exam with persistent BRB, concern for endometrial cancer -EMB completed as above -started on progesterone therapy -Further management  pending pathology report -Briefly discussed referral to GYN oncology and surgical intervention -Questions and concerns were addressed  Meds ordered this encounter  Medications   norethindrone (AYGESTIN) 5 MG tablet    Sig: Take 2 tablets (10 mg total) by mouth daily.    Dispense:  60 tablet    Refill:  6       Return for TBD.   Myna Hidalgo, DO Attending Obstetrician & Gynecologist, [facility] for [employer], [facility]

## 2022-11-28 NOTE — Progress Notes (Addendum)
US TV Sonohysterogram was preformed by Dr. Charlotta Newton. Ultrasound guidance and surveillance was used throughout the procedure. 60 cc of saline was injected into the endometrial cavity. The saline outlined a 3.3 x 1.7 x 2.8 cm vascular heterogeneous endometrial mass with irregular boarders,normal ovaries,multiple simple nabothian cysts  Chaperone: [employer]

## 2022-11-30 ENCOUNTER — Other Ambulatory Visit: Payer: Self-pay | Admitting: Obstetrics & Gynecology

## 2022-11-30 DIAGNOSIS — C541 Malignant neoplasm of endometrium: Secondary | ICD-10-CM

## 2022-11-30 NOTE — Progress Notes (Signed)
-  Referral to gyn/oncology -Recent path report confirmed endometrial carcinoma, grade I  Myna Hidalgo, DO Attending Obstetrician & Gynecologist, [facility] for [employer], [facility]

## 2022-12-01 ENCOUNTER — Telehealth: Payer: Self-pay | Admitting: *Deleted

## 2022-12-01 LAB — SURGICAL PATHOLOGY

## 2022-12-01 NOTE — Telephone Encounter (Signed)
Spoke with the patient regarding the referral to GYN oncology. Patient scheduled as new patient with Dr Pricilla Holm on 5/10 at 9:45 am.  Patient given an arrival time of 9:15 am.  Explained to the patient the the doctor will perform a pelvic exam at this visit. Patient given the policy that only one visitor allowed and that visitor must be over 16 yrs are allowed in the Cancer Center. Patient given the address/phone number for the clinic and that the center offers free valet service. Patient aware that masks are option.

## 2022-12-01 NOTE — Telephone Encounter (Signed)
LMOM to call the office back, patient needs to be scheduled a new patient appt on 5/10

## 2022-12-07 ENCOUNTER — Encounter: Payer: Self-pay | Admitting: Gynecologic Oncology

## 2022-12-08 NOTE — H&P (View-Only) (Signed)
GYNECOLOGIC ONCOLOGY NEW PATIENT CONSULTATION   Patient Name: Allison [name]  Patient Age: 56 y.o. Date of Service: 12/09/22 Referring Provider: Jenny Ozan, MD  Primary Care Provider: [facility], [place] [employer] Provider: Eliya Geiman, MD   Assessment/Plan:  Postmenopausal patient with low-grade endometrioid endometrial adenocarcinoma with tumor testing suggesting Lynch syndrome.  We reviewed the nature of endometrial cancer and its recommended surgical staging, including total hysterectomy, bilateral salpingo-oophorectomy, and lymph node assessment. The patient is a suitable candidate for staging via a minimally invasive approach to surgery.  We reviewed that robotic assistance would be used to complete the surgery.   We discussed that most endometrial cancer is detected early and that decisions regarding adjuvant therapy will be made based on her final pathology.   We reviewed the sentinel lymph node technique. Risks and benefits of sentinel lymph node biopsy was reviewed. We reviewed the technique and ICG dye. The patient DOES NOT have an iodine allergy or known liver dysfunction. We reviewed the false negative rate (0.4%), and that 3% of patients with metastatic disease will not have it detected by SLN biopsy in endometrial cancer. A low risk of allergic reaction to the dye, <0.2% for ICG, has been reported. We also discussed that in the case of failed mapping, which occurs 40% of the time, a bilateral or unilateral lymphadenectomy will be performed at the surgeon's discretion.   Potential benefits of sentinel nodes including a higher detection rate for metastasis due to ultrastaging and potential reduction in operative morbidity. However, there remains uncertainty as to the role for treatment of micrometastatic disease. Further, the benefit of operative morbidity associated with the SLN technique in endometrial cancer is not yet completely known. In other patient  populations (e.g. the cervical cancer population) there has been observed reductions in morbidity with SLN biopsy compared to pelvic lymphadenectomy. Lymphedema, nerve dysfunction and lymphocysts are all potential risks with the SLN technique as with complete lymphadenectomy. Additional risks to the patient include the risk of damage to an internal organ while operating in an altered view (e.g. the black and white image of the robotic fluorescence imaging mode).   We discussed the plan for a robotic assisted hysterectomy, bilateral salpingo-oophorectomy, sentinel lymph node evaluation, possible lymph node dissection, possible laparotomy. The risks of surgery were discussed in detail and she understands these to include infection; wound separation; hernia; vaginal cuff separation, injury to adjacent organs such as bowel, bladder, blood vessels, ureters and nerves; bleeding which may require blood transfusion; anesthesia risk; thromboembolic events; possible death; unforeseen complications; possible need for re-exploration; medical complications such as heart attack, stroke, pleural effusion and pneumonia; and, if full lymphadenectomy is performed the risk of lymphedema and lymphocyst. The patient will receive DVT and antibiotic prophylaxis as indicated. She voiced a clear understanding. She had the opportunity to ask questions. Perioperative instructions were reviewed with her. Prescriptions for post-op medications were sent to her pharmacy of choice.  Tumor testing reveals likely diagnosis of Lynch syndrome.  Referral to genetics was placed today.  I discussed this test result from her recent biopsy.  A copy of this note was sent to the patient's referring provider.   65 minutes of total time was spent for this patient encounter, including preparation, face-to-face counseling with the patient and coordination of care, and documentation of the encounter.  Caisen Mangas, MD  Division of Gynecologic  Oncology  Department of Obstetrics and Gynecology  [facility]  ___________________________________________  Chief Complaint: Chief Complaint    Patient presents with   Endometrial cancer (HCC)    History of Present Illness:  Allison [name] is a 56 y.o. y.o. female who is seen in consultation at the request of Dr. Ozan for an evaluation of endometrial cancer.  In the setting of postmenopausal bleeding, endometrial biopsy was performed on 11/28/2022.  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 P53 WT, loss of MSH6.  Pelvic ultrasound and [facility]yst[facility] the same day showed a uterus measuring 6.8 x 5.3 x 6.7 cm with an endometrial lining measuring 18 mm.  There is a 3.3 x 1.7 x 2.8 cm vascular and heterogenous endometrial mass with irregular borders.  Bilateral ovaries normal in appearance.  Today, the patient presents by herself.  She notes menopause at age 50 with a menstrual cycle in June of last year.  She bled for 5 days, bleeding like a regular cycle.  Bleeding then stopped and restarted in December 2023.  This was regular at first and then became heavy bleeding by the third week.  On her heaviest days, she would use for poise pads that were one-thirds saturated.  This was bleeding that happened almost daily.  Heavy bleeding continued in January.  Bleeding was then lighter in February and became intermittent in March and April.  In May, bleeding was still intermittent but became heavier again.  She began having some passage of clots, up to quarter sized as well as cramping.  Since starting Provera recently, her bleeding has significantly decreased.  She endorses an appetite that is up-and-down at baseline.  She denies any nausea or emesis.  She has had some constipation intermittently since February.  Used MiraLAX in April with good relief.  She denies any urinary  symptoms.  She has been diagnosed as prediabetic. Her last hemoglobin A1c in 09/2022 was 6.4%.  PAST MEDICAL HISTORY:  Past Medical History:  Diagnosis Date   Asthma    rare rescue inhaler use (not since 2021)   Borderline diabetes    Hyperlipidemia    Hypertension    Obesity      PAST SURGICAL HISTORY:  Past Surgical History:  Procedure Laterality Date   CHOLECYSTECTOMY     COLONOSCOPY N/A 07/15/2014   Procedure: COLONOSCOPY;  Surgeon: Sandi L Fields, MD;  [facility];  Service: Endoscopy;  Laterality: N/A;  9:30 AM - moved to 1:00 - Ginger to notify pt   REDUCTION MAMMAPLASTY Bilateral     OB/GYN HISTORY:  OB History  Gravida Para Term Preterm AB Living  3 3 3     3  SAB IAB Ectopic Multiple Live Births               # Outcome Date GA Lbr Len/2nd Weight Sex Delivery Anes PTL Lv  3 Term           2 Term           1 Term             Patient's last menstrual period was 12/09/2015.  Age at menarche: 9  Age at menopause: 50 Hx of HRT: Denies Hx of STDs: Denies Last pap: 08/2022 - NIML, HR HPV negative History of abnormal pap smears: Denies  SCREENING STUDIES:  Last mammogram: 09/2022  Last colonoscopy: 2015  MEDICATIONS: Outpatient Encounter Medications as of 12/09/2022  Medication Sig   albuterol (PROVENTIL HFA;VENTOLIN HFA) 108 (90 BASE) MCG/ACT inhaler Inhale 2   puffs into the lungs every 4 (four) hours as needed for wheezing.   aspirin EC 81 MG tablet Take 81 mg by mouth daily.   atorvastatin (LIPITOR) 10 MG tablet Take 10 mg by mouth daily.   Ergocalciferol (VITAMIN D2 PO) Take by mouth once a week.   hydrochlorothiazide 25 MG tablet Take 25 mg by mouth daily.     ibuprofen (ADVIL) 800 MG tablet Take 1 tablet (800 mg total) by mouth every 8 (eight) hours as needed for moderate pain. For AFTER surgery only   metformin (FORTAMET) 500 MG (OSM) 24 hr tablet Take 500 mg by mouth daily with breakfast.   norethindrone (AYGESTIN) 5 MG tablet Take 2  tablets (10 mg total) by mouth daily.   senna-docusate (SENOKOT-S) 8.6-50 MG tablet Take 2 tablets by mouth at bedtime. For AFTER surgery, do not take if having diarrhea   traMADol (ULTRAM) 50 MG tablet Take 1 tablet (50 mg total) by mouth every 6 (six) hours as needed for severe pain. For AFTER surgery only, do not take and drive   No facility-administered encounter medications on file as of 12/09/2022.    ALLERGIES:  No Known Allergies   FAMILY HISTORY:  Family History  Problem Relation Age of Onset   Cancer Mother        cervical   Stroke Father    Heart attack Father    Diabetes Sister    Heart disease Sister    Lupus Sister    Heart attack Brother    Stroke Brother    Diabetes Brother    Cancer Maternal Grandmother        breast cancer   Breast cancer Maternal Grandmother    Other Maternal Grandfather        MVA   Pancreatic cancer Paternal Grandmother    Cancer Paternal Grandfather        pancreatic cancer   Osteoporosis Daughter    Multiple sclerosis Daughter      SOCIAL HISTORY:  Social Connections: Socially Isolated (09/20/2022)   Social Connection and Isolation Panel [NHANES]    Frequency of Communication with Friends and Family: More than three times a week    Frequency of Social Gatherings with Friends and Family: Three times a week    Attends Religious Services: Never    Active Member of Clubs or Organizations: No    Attends Club or Organization Meetings: Never    Marital Status: Never married    REVIEW OF SYSTEMS:  + vaginal bleeding, muscle pain/cramp Denies appetite changes, fevers, chills, fatigue, unexplained weight changes. Denies hearing loss, neck lumps or masses, mouth sores, ringing in ears or voice changes. Denies cough or wheezing.  Denies shortness of breath. Denies chest pain or palpitations. Denies leg swelling. Denies abdominal distention, pain, blood in stools, constipation, diarrhea, nausea, vomiting, or early satiety. Denies pain with  intercourse, dysuria, frequency, hematuria or incontinence. Denies hot flashes, pelvic pain, or vaginal discharge.   Denies joint pain, back pain. Denies itching, rash, or wounds. Denies dizziness, headaches, numbness or seizures. Denies swollen lymph nodes or glands, denies easy bruising or bleeding. Denies anxiety, depression, confusion, or decreased concentration.  Physical Exam:  Vital Signs for this encounter:  Blood pressure (!) 144/77, pulse 88, temperature 97.7 F (36.5 C), temperature source Oral, resp. rate 16, height 5' 2.99" (1.6 m), weight 254 lb 6.4 oz (115.4 kg), last menstrual period 12/09/2015, SpO2 99 %. Body mass index is 45.08 kg/m. General: Alert, oriented, no acute distress.    HEENT: Normocephalic, atraumatic. Sclera anicteric.  Chest: Clear to auscultation bilaterally. No wheezes, rhonchi, or rales. Cardiovascular: Regular rate and rhythm, no murmurs, rubs, or gallops.  Abdomen: Obese. Normoactive bowel sounds. Soft, nondistended, nontender to palpation. No masses or hepatosplenomegaly appreciated. No palpable fluid wave.  Extremities: Grossly normal range of motion. Warm, well perfused. No edema bilaterally.  Skin: No rashes or lesions.  Lymphatics: No cervical, supraclavicular, or inguinal adenopathy.  GU:  Normal external female genitalia. No lesions. No discharge or bleeding.             Bladder/urethra:  No lesions or masses, well supported bladder             Vagina: Minimal blood within the vagina, normal-appearing vaginal mucosa.             Cervix: Normal appearing, no lesions.             Uterus: small, mobile, no parametrial involvement or nodularity.             Adnexa: No masses appreciated.  Rectal: Deferred.  LABORATORY AND RADIOLOGIC DATA:  Outside medical records were reviewed to synthesize the above history, along with the history and physical obtained during the visit.   Lab Results  Component Value Date   WBC 4.9 07/07/2019   HGB 13.5  07/07/2019   HCT 42.3 07/07/2019   PLT 314 07/07/2019   GLUCOSE 111 (H) 07/07/2019   ALT 33 07/07/2019   AST 42 (H) 07/07/2019   NA 136 07/07/2019   K 3.0 (L) 07/07/2019   CL 95 (L) 07/07/2019   CREATININE 0.93 07/07/2019   BUN 14 07/07/2019   CO2 27 07/07/2019   INR 0.95 08/17/2011    

## 2022-12-08 NOTE — Progress Notes (Signed)
GYNECOLOGIC ONCOLOGY NEW PATIENT CONSULTATION   Patient Name: Allison [name]  Patient Age: 57 y.o. Date of Service: 12/09/22 Referring Provider: Katha Hamming, MD  Primary Care Provider: [facility], [facility] [employer] Provider: Eugene Garnet, MD   Assessment/Plan:  Postmenopausal patient with low-grade endometrioid endometrial adenocarcinoma with tumor testing suggesting Lynch syndrome.  We reviewed the nature of endometrial cancer and its recommended surgical staging, including total hysterectomy, bilateral salpingo-oophorectomy, and lymph node assessment. The patient is a suitable candidate for staging via a minimally invasive approach to surgery.  We reviewed that robotic assistance would be used to complete the surgery.   We discussed that most endometrial cancer is detected early and that decisions regarding adjuvant therapy will be made based on her final pathology.   We reviewed the sentinel lymph node technique. Risks and benefits of sentinel lymph node biopsy was reviewed. We reviewed the technique and ICG dye. The patient DOES NOT have an iodine allergy or known liver dysfunction. We reviewed the false negative rate (0.4%), and that 3% of patients with metastatic disease will not have it detected by SLN biopsy in endometrial cancer. A low risk of allergic reaction to the dye, <0.2% for ICG, has been reported. We also discussed that in the case of failed mapping, which occurs 40% of the time, a bilateral or unilateral lymphadenectomy will be performed at the surgeon's discretion.   Potential benefits of sentinel nodes including a higher detection rate for metastasis due to ultrastaging and potential reduction in operative morbidity. However, there remains uncertainty as to the role for treatment of micrometastatic disease. Further, the benefit of operative morbidity associated with the SLN technique in endometrial cancer is not yet completely known. In other patient  populations (e.g. the cervical cancer population) there has been observed reductions in morbidity with SLN biopsy compared to pelvic lymphadenectomy. Lymphedema, nerve dysfunction and lymphocysts are all potential risks with the SLN technique as with complete lymphadenectomy. Additional risks to the patient include the risk of damage to an internal organ while operating in an altered view (e.g. the black and white image of the robotic fluorescence imaging mode).   We discussed the plan for a robotic assisted hysterectomy, bilateral salpingo-oophorectomy, sentinel lymph node evaluation, possible lymph node dissection, possible laparotomy. The risks of surgery were discussed in detail and she understands these to include infection; wound separation; hernia; vaginal cuff separation, injury to adjacent organs such as bowel, bladder, blood vessels, ureters and nerves; bleeding which may require blood transfusion; anesthesia risk; thromboembolic events; possible death; unforeseen complications; possible need for re-exploration; medical complications such as heart attack, stroke, pleural effusion and pneumonia; and, if full lymphadenectomy is performed the risk of lymphedema and lymphocyst. The patient will receive DVT and antibiotic prophylaxis as indicated. She voiced a clear understanding. She had the opportunity to ask questions. Perioperative instructions were reviewed with her. Prescriptions for post-op medications were sent to her pharmacy of choice.  Tumor testing reveals likely diagnosis of Lynch syndrome.  Referral to genetics was placed today.  I discussed this test result from her recent biopsy.  A copy of this note was sent to the patient's referring provider.   65 minutes of total time was spent for this patient encounter, including preparation, face-to-face counseling with the patient and coordination of care, and documentation of the encounter.  Eugene Garnet, MD  Division of Gynecologic  Oncology  Department of Obstetrics and Gynecology  [facility]  ___________________________________________  Chief Complaint: Chief Complaint  Patient presents with   Endometrial cancer [facility])    History of Present Illness:  Allison [name] is a 57 y.o. y.o. female who is seen in consultation at the request of Dr. Charlotta Newton for an evaluation of endometrial cancer.  In the setting of postmenopausal bleeding, endometrial biopsy was performed on 11/28/2022.  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 P53 WT, loss of MSH6.  Pelvic ultrasound and sonohystogram performed [facility] on the same day showed a uterus measuring 6.8 x 5.3 x 6.7 cm with an endometrial lining measuring 18 mm.  There is a 3.3 x 1.7 x 2.8 cm vascular and heterogenous endometrial mass with irregular borders.  Bilateral ovaries normal in appearance.  Today, the patient presents by herself.  She notes menopause at age 66 with a menstrual cycle in June of last year.  She bled for 5 days, bleeding like a regular cycle.  Bleeding then stopped and restarted in December 2023.  This was regular at first and then became heavy bleeding by the third week.  On her heaviest days, she would use for poise pads that were one-thirds saturated.  This was bleeding that happened almost daily.  Heavy bleeding continued in January.  Bleeding was then lighter in February and became intermittent in March and April.  In May, bleeding was still intermittent but became heavier again.  She began having some passage of clots, up to quarter sized as well as cramping.  Since starting Provera recently, her bleeding has significantly decreased.  She endorses an appetite that is up-and-down at baseline.  She denies any nausea or emesis.  She has had some constipation intermittently since February.  Used MiraLAX in April with good relief.  She denies any urinary  symptoms.  She has been diagnosed as prediabetic. Her last hemoglobin A1c in 09/2022 was 6.4%.  PAST MEDICAL HISTORY:  Past Medical History:  Diagnosis Date   Asthma    rare rescue inhaler use (not since 2021)   Borderline diabetes    Hyperlipidemia    Hypertension    Obesity      PAST SURGICAL HISTORY:  Past Surgical History:  Procedure Laterality Date   CHOLECYSTECTOMY     COLONOSCOPY N/A 07/15/2014   Procedure: COLONOSCOPY;  Surgeon: West Bali, MD;  [facility];  Service: Endoscopy;  Laterality: N/A;  9:30 AM - moved to 1:00 - Ginger to notify pt   REDUCTION MAMMAPLASTY Bilateral     OB/GYN HISTORY:  OB History  Gravida Para Term Preterm AB Living  3 3 3     3   SAB IAB Ectopic Multiple Live Births               # Outcome Date GA Lbr Len/2nd Weight Sex Delivery Anes PTL Lv  3 Term           2 Term           1 Term             Patient's last menstrual period was 12/09/2015.  Age at menarche: 42  Age at menopause: 28 Hx of HRT: Denies Hx of STDs: Denies Last pap: 08/2022 - NIML, HR HPV negative History of abnormal pap smears: Denies  SCREENING STUDIES:  Last mammogram: 09/2022  Last colonoscopy: 2015  MEDICATIONS: Outpatient Encounter Medications as of 12/09/2022  Medication Sig   albuterol (PROVENTIL HFA;VENTOLIN HFA) 108 (90 BASE) MCG/ACT inhaler Inhale 2  puffs into the lungs every 4 (four) hours as needed for wheezing.   aspirin EC 81 MG tablet Take 81 mg by mouth daily.   atorvastatin (LIPITOR) 10 MG tablet Take 10 mg by mouth daily.   Ergocalciferol (VITAMIN D2 PO) Take by mouth once a week.   hydrochlorothiazide 25 MG tablet Take 25 mg by mouth daily.     ibuprofen (ADVIL) 800 MG tablet Take 1 tablet (800 mg total) by mouth every 8 (eight) hours as needed for moderate pain. For AFTER surgery only   metformin (FORTAMET) 500 MG (OSM) 24 hr tablet Take 500 mg by mouth daily with breakfast.   norethindrone (AYGESTIN) 5 MG tablet Take 2  tablets (10 mg total) by mouth daily.   senna-docusate (SENOKOT-S) 8.6-50 MG tablet Take 2 tablets by mouth at bedtime. For AFTER surgery, do not take if having diarrhea   traMADol (ULTRAM) 50 MG tablet Take 1 tablet (50 mg total) by mouth every 6 (six) hours as needed for severe pain. For AFTER surgery only, do not take and drive   No facility-administered encounter medications on file as of 12/09/2022.    ALLERGIES:  No Known Allergies   FAMILY HISTORY:  Family History  Problem Relation Age of Onset   Cancer Mother        cervical   Stroke Father    Heart attack Father    Diabetes Sister    Heart disease Sister    Lupus Sister    Heart attack Brother    Stroke Brother    Diabetes Brother    Cancer Maternal Grandmother        breast cancer   Breast cancer Maternal Grandmother    Other Maternal Grandfather        MVA   Pancreatic cancer Paternal Grandmother    Cancer Paternal Grandfather        pancreatic cancer   Osteoporosis Daughter    Multiple sclerosis Daughter      SOCIAL HISTORY:  Social Connections: Socially Isolated (09/20/2022)   Social Connection and Isolation Panel [NHANES]    Frequency of Communication with Friends and Family: More than three times a week    Frequency of Social Gatherings with Friends and Family: Three times a week    Attends Religious Services: Never    Active Member of Clubs or Organizations: No    Attends Banker Meetings: Never    Marital Status: Never married    REVIEW OF SYSTEMS:  + vaginal bleeding, muscle pain/cramp Denies appetite changes, fevers, chills, fatigue, unexplained weight changes. Denies hearing loss, neck lumps or masses, mouth sores, ringing in ears or voice changes. Denies cough or wheezing.  Denies shortness of breath. Denies chest pain or palpitations. Denies leg swelling. Denies abdominal distention, pain, blood in stools, constipation, diarrhea, nausea, vomiting, or early satiety. Denies pain with  intercourse, dysuria, frequency, hematuria or incontinence. Denies hot flashes, pelvic pain, or vaginal discharge.   Denies joint pain, back pain. Denies itching, rash, or wounds. Denies dizziness, headaches, numbness or seizures. Denies swollen lymph nodes or glands, denies easy bruising or bleeding. Denies anxiety, depression, confusion, or decreased concentration.  Physical Exam:  Vital Signs for this encounter:  Blood pressure (!) 144/77, pulse 88, temperature 97.7 F (36.5 C), temperature source Oral, resp. rate 16, height 5' 2.99" (1.6 m), weight 254 lb 6.4 oz (115.4 kg), last menstrual period 12/09/2015, SpO2 99 %. Body mass index is 45.08 kg/m. General: Alert, oriented, no acute distress.  HEENT: Normocephalic, atraumatic. Sclera anicteric.  Chest: Clear to auscultation bilaterally. No wheezes, rhonchi, or rales. Cardiovascular: Regular rate and rhythm, no murmurs, rubs, or gallops.  Abdomen: Obese. Normoactive bowel sounds. Soft, nondistended, nontender to palpation. No masses or hepatosplenomegaly appreciated. No palpable fluid wave.  Extremities: Grossly normal range of motion. Warm, well perfused. No edema bilaterally.  Skin: No rashes or lesions.  Lymphatics: No cervical, supraclavicular, or inguinal adenopathy.  GU:  Normal external female genitalia. No lesions. No discharge or bleeding.             Bladder/urethra:  No lesions or masses, well supported bladder             Vagina: Minimal blood within the vagina, normal-appearing vaginal mucosa.             Cervix: Normal appearing, no lesions.             Uterus: small, mobile, no parametrial involvement or nodularity.             Adnexa: No masses appreciated.  Rectal: Deferred.  LABORATORY AND RADIOLOGIC DATA:  Outside medical records were reviewed to synthesize the above history, along with the history and physical obtained during the visit.   Lab Results  Component Value Date   WBC 4.9 07/07/2019   HGB 13.5  07/07/2019   HCT 42.3 07/07/2019   PLT 314 07/07/2019   GLUCOSE 111 (H) 07/07/2019   ALT 33 07/07/2019   AST 42 (H) 07/07/2019   NA 136 07/07/2019   K 3.0 (L) 07/07/2019   CL 95 (L) 07/07/2019   CREATININE 0.93 07/07/2019   BUN 14 07/07/2019   CO2 27 07/07/2019   INR 0.95 08/17/2011

## 2022-12-09 ENCOUNTER — Other Ambulatory Visit: Payer: Self-pay | Admitting: Gynecologic Oncology

## 2022-12-09 ENCOUNTER — Inpatient Hospital Stay: Payer: Medicaid Other | Attending: Obstetrics and Gynecology | Admitting: Gynecologic Oncology

## 2022-12-09 ENCOUNTER — Other Ambulatory Visit: Payer: Self-pay

## 2022-12-09 ENCOUNTER — Encounter: Payer: Self-pay | Admitting: Gynecologic Oncology

## 2022-12-09 ENCOUNTER — Inpatient Hospital Stay (HOSPITAL_BASED_OUTPATIENT_CLINIC_OR_DEPARTMENT_OTHER): Payer: Medicaid Other | Admitting: Gynecologic Oncology

## 2022-12-09 VITALS — BP 144/77 | HR 88 | Temp 97.7°F | Resp 16 | Ht 62.99 in | Wt 254.4 lb

## 2022-12-09 DIAGNOSIS — I10 Essential (primary) hypertension: Secondary | ICD-10-CM | POA: Diagnosis not present

## 2022-12-09 DIAGNOSIS — Z803 Family history of malignant neoplasm of breast: Secondary | ICD-10-CM | POA: Diagnosis not present

## 2022-12-09 DIAGNOSIS — R7303 Prediabetes: Secondary | ICD-10-CM | POA: Diagnosis not present

## 2022-12-09 DIAGNOSIS — E785 Hyperlipidemia, unspecified: Secondary | ICD-10-CM | POA: Insufficient documentation

## 2022-12-09 DIAGNOSIS — Z8049 Family history of malignant neoplasm of other genital organs: Secondary | ICD-10-CM | POA: Insufficient documentation

## 2022-12-09 DIAGNOSIS — Z79890 Hormone replacement therapy (postmenopausal): Secondary | ICD-10-CM | POA: Diagnosis not present

## 2022-12-09 DIAGNOSIS — C541 Malignant neoplasm of endometrium: Secondary | ICD-10-CM | POA: Diagnosis not present

## 2022-12-09 DIAGNOSIS — Z6841 Body Mass Index (BMI) 40.0 and over, adult: Secondary | ICD-10-CM | POA: Insufficient documentation

## 2022-12-09 DIAGNOSIS — Z7982 Long term (current) use of aspirin: Secondary | ICD-10-CM | POA: Insufficient documentation

## 2022-12-09 DIAGNOSIS — Z780 Asymptomatic menopausal state: Secondary | ICD-10-CM | POA: Diagnosis not present

## 2022-12-09 DIAGNOSIS — Z7984 Long term (current) use of oral hypoglycemic drugs: Secondary | ICD-10-CM | POA: Diagnosis not present

## 2022-12-09 DIAGNOSIS — Z79899 Other long term (current) drug therapy: Secondary | ICD-10-CM | POA: Diagnosis not present

## 2022-12-09 DIAGNOSIS — J45909 Unspecified asthma, uncomplicated: Secondary | ICD-10-CM | POA: Insufficient documentation

## 2022-12-09 DIAGNOSIS — Z800 Family history of malignant neoplasm of digestive organs: Secondary | ICD-10-CM

## 2022-12-09 DIAGNOSIS — N950 Postmenopausal bleeding: Secondary | ICD-10-CM | POA: Diagnosis not present

## 2022-12-09 MED ORDER — SENNOSIDES-DOCUSATE SODIUM 8.6-50 MG PO TABS: 2.0000 | ORAL_TABLET | Freq: Every day | ORAL | 0 refills | Status: DC

## 2022-12-09 MED ORDER — IBUPROFEN 800 MG PO TABS: 800.0000 mg | ORAL_TABLET | Freq: Three times a day (TID) | ORAL | 0 refills | Status: DC | PRN

## 2022-12-09 MED ORDER — TRAMADOL HCL 50 MG PO TABS: 50.0000 mg | ORAL_TABLET | Freq: Four times a day (QID) | ORAL | 0 refills | Status: DC | PRN

## 2022-12-09 NOTE — Progress Notes (Signed)
Patient here for new patient consultation with Dr. Pricilla Holm and for a pre-operative appointment prior to her scheduled surgery on Dec 29, 2022. She is scheduled for robotic assisted total laparoscopic hysterectomy (removal of the uterus and cervix), bilateral salpingo-oophorectomy (removal of both ovaries and fallopian tubes), sentinel lymph node biopsy, possible lymph node dissection, possible laparotomy (larger incision on your abdomen if needed). The surgery was discussed in detail.  See after visit summary for additional details.       Discussed post-op pain management in detail including the aspects of the enhanced recovery pathway.  Advised her that a new prescription would be sent in for tramadol and it is only to be used for after her upcoming surgery.  We discussed the use of tylenol post-op and to monitor for a maximum of 4,000 mg in a 24 hour period.  Also prescribed sennakot to be used after surgery and to hold if having loose stools.  Discussed bowel regimen in detail.     Discussed the use of SCDs and measures to take at home to prevent DVT including frequent mobility.  Reportable signs and symptoms of DVT discussed. Post-operative instructions discussed and expectations for after surgery. Incisional care discussed as well including reportable signs and symptoms including erythema, drainage, wound separation.     10 minutes spent with the patient/preparing information.  Verbalizing understanding of material discussed. No needs or concerns voiced at the end of the visit.   Advised patient to call for any needs.  Advised that her post-operative medications had been prescribed and could be picked up at any time.    This appointment is included in the global surgical bundle as pre-operative teaching and has no charge.

## 2022-12-09 NOTE — Patient Instructions (Addendum)
We will arrange for you to [facility] with [facility].   Preparing for your Surgery   Plan for surgery on Dec 29, 2022 with Dr. Katherine Tucker at [facility]. You will be scheduled for robotic assisted total laparoscopic hysterectomy (removal of the uterus and cervix), bilateral salpingo-oophorectomy (removal of both ovaries and fallopian tubes), sentinel lymph node biopsy, possible lymph node dissection, possible laparotomy (larger incision on your abdomen if needed).   Pre-operative Testing -You will receive a phone call from presurgical testing at [facility] to arrange for a pre-operative appointment and lab work.   -Bring your insurance card, copy of an advanced directive if applicable, medication list   -At that visit, you will be asked to sign a consent for a possible blood transfusion in case a transfusion becomes necessary during surgery.  The need for a blood transfusion is rare but having consent is a necessary part of your care.      -DO NOT TAKE YOUR BABY ASPIRIN THE DAY OF SURGERY.   -Do not take supplements such as fish oil (omega 3), red yeast rice, turmeric before your surgery. You want to avoid medications with aspirin in them including headache powders such as BC or Goody's), Excedrin migraine.   Day Before Surgery at Home -You will be asked to take in a light diet the day before surgery. You will be advised you can have clear liquids up until 3 hours before your surgery.     Eat a light diet the day before surgery.  Examples including soups, broths, toast, yogurt, mashed potatoes.  AVOID GAS PRODUCING FOODS AND BEVERAGES. Things to avoid include carbonated beverages (fizzy beverages, sodas), raw fruits and raw vegetables (uncooked), or beans.    If your bowels are filled with gas, your surgeon will have difficulty visualizing your pelvic organs which increases your surgical risks.   Your role in recovery Your role is to  become active as soon as directed by your doctor, while still giving yourself time to heal.  Rest when you feel tired. You will be asked to do the following in order to speed your recovery:   - Cough and breathe deeply. This helps to clear and expand your lungs and can prevent pneumonia after surgery.  - STAY ACTIVE WHEN YOU GET HOME. Do mild physical activity. Walking or moving your legs help your circulation and body functions return to normal. Do not try to get up or walk alone the first time after surgery.   -If you develop swelling on one leg or the other, pain in the back of your leg, redness/warmth in one of your legs, please call the office or go to the Emergency Room to have a doppler to rule out a blood clot. For shortness of breath, chest pain-seek care in the Emergency Room as soon as possible. - Actively manage your pain. Managing your pain lets you move in comfort. We will ask you to rate your pain on a scale of zero to 10. It is your responsibility to tell your doctor or nurse where and how much you hurt so your pain can be treated.   Special Considerations -If you are diabetic, you may be placed on insulin after surgery to have closer control over your blood sugars to promote healing and recovery.  This does not mean that you will be discharged on insulin.  If applicable, your oral antidiabetics will be resumed when you are tolerating a solid   diet.   -Your final pathology results from surgery should be available around one week after surgery and the results will be relayed to you when available.   -FMLA forms can be faxed to [phone] and please allow 5-7 business days for completion.   Pain Management After Surgery -You have been prescribed your pain medication and bowel regimen medications before surgery so that you can have these available when you are discharged from the hospital. The pain medication is for use ONLY AFTER surgery and a new prescription will not be given.     -Make sure that you have Tylenol and Ibuprofen IF YOU ARE ABLE TO TAKE THESE MEDICATIONS at home to use on a regular basis after surgery for pain control. We recommend alternating the medications every hour to six hours since they work differently and are processed in the body differently for pain relief.   -Review the attached handout on narcotic use and their risks and side effects.    Bowel Regimen -You have been prescribed Sennakot-S to take nightly to prevent constipation especially if you are taking the narcotic pain medication intermittently.  It is important to prevent constipation and drink adequate amounts of liquids. You can stop taking this medication when you are not taking pain medication and you are back on your normal bowel routine.   Risks of Surgery Risks of surgery are low but include bleeding, infection, damage to surrounding structures, re-operation, blood clots, and very rarely death.     Blood Transfusion Information (For the consent to be signed before surgery)   We will be checking your blood type before surgery so in case of emergencies, we will know what type of blood you would need.                                             WHAT IS A BLOOD TRANSFUSION?   A transfusion is the replacement of blood or some of its parts. Blood is made up of multiple cells which provide different functions. Red blood cells carry oxygen and are used for blood loss replacement. White blood cells fight against infection. Platelets control bleeding. Plasma helps clot blood. Other blood products are available for specialized needs, such as hemophilia or other clotting disorders. BEFORE THE TRANSFUSION  Who gives blood for transfusions?  You may be able to donate blood to be used at a later date on yourself (autologous donation). Relatives can be asked to donate blood. This is generally not any safer than if you have received blood from a stranger. The same precautions are taken to  ensure safety when a relative's blood is donated. Healthy volunteers who are fully evaluated to make sure their blood is safe. This is blood bank blood. Transfusion therapy is the safest it has ever been in the practice of medicine. Before blood is taken from a donor, a complete history is taken to make sure that person has no history of diseases nor engages in risky social behavior (examples are intravenous drug use or sexual activity with multiple partners). The donor's travel history is screened to minimize risk of transmitting infections, such as malaria. The donated blood is tested for signs of infectious diseases, such as HIV and hepatitis. The blood is then tested to be sure it is compatible with you in order to minimize the chance of a transfusion reaction. If you or a   relative donates blood, this is often done in anticipation of surgery and is not appropriate for emergency situations. It takes many days to process the donated blood. RISKS AND COMPLICATIONS Although transfusion therapy is very safe and saves many lives, the main dangers of transfusion include:  Getting an infectious disease. Developing a transfusion reaction. This is an allergic reaction to something in the blood you were given. Every precaution is taken to prevent this. The decision to have a blood transfusion has been considered carefully by your caregiver before blood is given. Blood is not given unless the benefits outweigh the risks.   AFTER SURGERY INSTRUCTIONS   Return to work: 4-6 weeks if applicable   Activity: 1. Be up and out of the bed during the day.  Take a nap if needed.  You may walk up steps but be careful and use the hand rail.  Stair climbing will tire you more than you think, you may need to stop part way and rest.    2. No lifting or straining for 6 weeks over 10 pounds. No pushing, pulling, straining for 6 weeks.   3. No driving for around 1 week(s).  Do not drive if you are taking narcotic pain  medicine and make sure that your reaction time has returned.    4. You can shower as soon as the next day after surgery. Shower daily.  Use your regular soap and water (not directly on the incision) and pat your incision(s) dry afterwards; don't rub.  No tub baths or submerging your body in water until cleared by your surgeon. If you have the soap that was given to you by pre-surgical testing that was used before surgery, you do not need to use it afterwards because this can irritate your incisions.    5. No sexual activity and nothing in the vagina for 10-12 weeks.   6. You may experience a small amount of clear drainage from your incisions, which is normal.  If the drainage persists, increases, or changes color please call the office.   7. Do not use creams, lotions, or ointments such as neosporin on your incisions after surgery until advised by your surgeon because they can cause removal of the dermabond glue on your incisions.     8. You may experience vaginal spotting after surgery or around the 6-8 week mark from surgery when the stitches at the top of the vagina begin to dissolve.  The spotting is normal but if you experience heavy bleeding, call our office.   9. Take Tylenol or ibuprofen first for pain if you are able to take these medications and only use narcotic pain medication for severe pain not relieved by the Tylenol or Ibuprofen.  Monitor your Tylenol intake to a max of 4,000 mg in a 24 hour period. You can alternate these medications after surgery.   Diet: 1. Low sodium Heart Healthy Diet is recommended but you are cleared to resume your normal (before surgery) diet after your procedure.   2. It is safe to use a laxative, such as Miralax or Colace, if you have difficulty moving your bowels. You have been prescribed Sennakot-S to take at bedtime every evening after surgery to keep bowel movements regular and to prevent constipation.     Wound Care: 1. Keep clean and dry.  Shower  daily.   Reasons to call the Doctor: Fever - Oral temperature greater than 100.4 degrees Fahrenheit Foul-smelling vaginal discharge Difficulty urinating Nausea and vomiting Increased pain at   the site of the incision that is unrelieved with pain medicine. Difficulty breathing with or without chest pain New calf pain especially if only on one side Sudden, continuing increased vaginal bleeding with or without clots.   Contacts: For questions or concerns you should contact:   Dr. Katherine Tucker at [phone]   Amalio Loe, NP at [phone]   After Hours: call [phone] and have the GYN Oncologist paged/contacted (after 5 pm or on the weekends). You will speak with an after hours RN and let he or she know you have had surgery.   Messages sent via mychart are for non-urgent matters and are not responded to after hours so for urgent needs, please call the after hours number.   

## 2022-12-09 NOTE — Patient Instructions (Signed)
We will arrange for you to meet with a genetics counselor here at the [facility].   Preparing for your Surgery   Plan for surgery on Dec 29, 2022 with Dr. Eugene Garnet at [facility]. You will be scheduled for robotic assisted total laparoscopic hysterectomy (removal of the uterus and cervix), bilateral salpingo-oophorectomy (removal of both ovaries and fallopian tubes), sentinel lymph node biopsy, possible lymph node dissection, possible laparotomy (larger incision on your abdomen if needed).   Pre-operative Testing -You will receive a phone call from presurgical testing at [facility] to arrange for a pre-operative appointment and lab work.   -Bring your insurance card, copy of an advanced directive if applicable, medication list   -At that visit, you will be asked to sign a consent for a possible blood transfusion in case a transfusion becomes necessary during surgery.  The need for a blood transfusion is rare but having consent is a necessary part of your care.      -DO NOT TAKE YOUR BABY ASPIRIN THE DAY OF SURGERY.   -Do not take supplements such as fish oil (omega 3), red yeast rice, turmeric before your surgery. You want to avoid medications with aspirin in them including headache powders such as BC or Goody's), Excedrin migraine.   Day Before Surgery at Home -You will be asked to take in a light diet the day before surgery. You will be advised you can have clear liquids up until 3 hours before your surgery.     Eat a light diet the day before surgery.  Examples including soups, broths, toast, yogurt, mashed potatoes.  AVOID GAS PRODUCING FOODS AND BEVERAGES. Things to avoid include carbonated beverages (fizzy beverages, sodas), raw fruits and raw vegetables (uncooked), or beans.    If your bowels are filled with gas, your surgeon will have difficulty visualizing your pelvic organs which increases your surgical risks.   Your role in recovery Your role is to  become active as soon as directed by your doctor, while still giving yourself time to heal.  Rest when you feel tired. You will be asked to do the following in order to speed your recovery:   - Cough and breathe deeply. This helps to clear and expand your lungs and can prevent pneumonia after surgery.  - STAY ACTIVE WHEN YOU GET HOME. Do mild physical activity. Walking or moving your legs help your circulation and body functions return to normal. Do not try to get up or walk alone the first time after surgery.   -If you develop swelling on one leg or the other, pain in the back of your leg, redness/warmth in one of your legs, please call the office or go to the Emergency Room to have a doppler to rule out a blood clot. For shortness of breath, chest pain-seek care in the Emergency Room as soon as possible. - Actively manage your pain. Managing your pain lets you move in comfort. We will ask you to rate your pain on a scale of zero to 10. It is your responsibility to tell your doctor or nurse where and how much you hurt so your pain can be treated.   Special Considerations -If you are diabetic, you may be placed on insulin after surgery to have closer control over your blood sugars to promote healing and recovery.  This does not mean that you will be discharged on insulin.  If applicable, your oral antidiabetics will be resumed when you are tolerating a solid  diet.   -Your final pathology results from surgery should be available around one week after surgery and the results will be relayed to you when available.   -FMLA forms can be faxed to [phone] and please allow 5-7 business days for completion.   Pain Management After Surgery -You have been prescribed your pain medication and bowel regimen medications before surgery so that you can have these available when you are discharged from the hospital. The pain medication is for use ONLY AFTER surgery and a new prescription will not be given.     -Make sure that you have Tylenol and Ibuprofen IF YOU ARE ABLE TO TAKE THESE MEDICATIONS at home to use on a regular basis after surgery for pain control. We recommend alternating the medications every hour to six hours since they work differently and are processed in the body differently for pain relief.   -Review the attached handout on narcotic use and their risks and side effects.    Bowel Regimen -You have been prescribed Sennakot-S to take nightly to prevent constipation especially if you are taking the narcotic pain medication intermittently.  It is important to prevent constipation and drink adequate amounts of liquids. You can stop taking this medication when you are not taking pain medication and you are back on your normal bowel routine.   Risks of Surgery Risks of surgery are low but include bleeding, infection, damage to surrounding structures, re-operation, blood clots, and very rarely death.     Blood Transfusion Information (For the consent to be signed before surgery)   We will be checking your blood type before surgery so in case of emergencies, we will know what type of blood you would need.                                             WHAT IS A BLOOD TRANSFUSION?   A transfusion is the replacement of blood or some of its parts. Blood is made up of multiple cells which provide different functions. Red blood cells carry oxygen and are used for blood loss replacement. White blood cells fight against infection. Platelets control bleeding. Plasma helps clot blood. Other blood products are available for specialized needs, such as hemophilia or other clotting disorders. BEFORE THE TRANSFUSION  Who gives blood for transfusions?  You may be able to donate blood to be used at a later date on yourself (autologous donation). Relatives can be asked to donate blood. This is generally not any safer than if you have received blood from a stranger. The same precautions are taken to  ensure safety when a relative's blood is donated. Healthy volunteers who are fully evaluated to make sure their blood is safe. This is blood bank blood. Transfusion therapy is the safest it has ever been in the practice of medicine. Before blood is taken from a donor, a complete history is taken to make sure that person has no history of diseases nor engages in risky social behavior (examples are intravenous drug use or sexual activity with multiple partners). The donor's travel history is screened to minimize risk of transmitting infections, such as malaria. The donated blood is tested for signs of infectious diseases, such as HIV and hepatitis. The blood is then tested to be sure it is compatible with you in order to minimize the chance of a transfusion reaction. If you or a  relative donates blood, this is often done in anticipation of surgery and is not appropriate for emergency situations. It takes many days to process the donated blood. RISKS AND COMPLICATIONS Although transfusion therapy is very safe and saves many lives, the main dangers of transfusion include:  Getting an infectious disease. Developing a transfusion reaction. This is an allergic reaction to something in the blood you were given. Every precaution is taken to prevent this. The decision to have a blood transfusion has been considered carefully by your caregiver before blood is given. Blood is not given unless the benefits outweigh the risks.   AFTER SURGERY INSTRUCTIONS   Return to work: 4-6 weeks if applicable   Activity: 1. Be up and out of the bed during the day.  Take a nap if needed.  You may walk up steps but be careful and use the hand rail.  Stair climbing will tire you more than you think, you may need to stop part way and rest.    2. No lifting or straining for 6 weeks over 10 pounds. No pushing, pulling, straining for 6 weeks.   3. No driving for around 1 week(s).  Do not drive if you are taking narcotic pain  medicine and make sure that your reaction time has returned.    4. You can shower as soon as the next day after surgery. Shower daily.  Use your regular soap and water (not directly on the incision) and pat your incision(s) dry afterwards; don't rub.  No tub baths or submerging your body in water until cleared by your surgeon. If you have the soap that was given to you by pre-surgical testing that was used before surgery, you do not need to use it afterwards because this can irritate your incisions.    5. No sexual activity and nothing in the vagina for 10-12 weeks.   6. You may experience a small amount of clear drainage from your incisions, which is normal.  If the drainage persists, increases, or changes color please call the office.   7. Do not use creams, lotions, or ointments such as neosporin on your incisions after surgery until advised by your surgeon because they can cause removal of the dermabond glue on your incisions.     8. You may experience vaginal spotting after surgery or around the 6-8 week mark from surgery when the stitches at the top of the vagina begin to dissolve.  The spotting is normal but if you experience heavy bleeding, call our office.   9. Take Tylenol or ibuprofen first for pain if you are able to take these medications and only use narcotic pain medication for severe pain not relieved by the Tylenol or Ibuprofen.  Monitor your Tylenol intake to a max of 4,000 mg in a 24 hour period. You can alternate these medications after surgery.   Diet: 1. Low sodium Heart Healthy Diet is recommended but you are cleared to resume your normal (before surgery) diet after your procedure.   2. It is safe to use a laxative, such as Miralax or Colace, if you have difficulty moving your bowels. You have been prescribed Sennakot-S to take at bedtime every evening after surgery to keep bowel movements regular and to prevent constipation.     Wound Care: 1. Keep clean and dry.  Shower  daily.   Reasons to call the Doctor: Fever - Oral temperature greater than 100.4 degrees Fahrenheit Foul-smelling vaginal discharge Difficulty urinating Nausea and vomiting Increased pain at  the site of the incision that is unrelieved with pain medicine. Difficulty breathing with or without chest pain New calf pain especially if only on one side Sudden, continuing increased vaginal bleeding with or without clots.   Contacts: For questions or concerns you should contact:   Dr. Eugene Garnet at [phone]   Warner Mccreedy, NP at [phone]   After Hours: call [phone] and have the GYN Oncologist paged/contacted (after 5 pm or on the weekends). You will speak with an after hours RN and let he or she know you have had surgery.   Messages sent via mychart are for non-urgent matters and are not responded to after hours so for urgent needs, please call the after hours number.

## 2022-12-14 ENCOUNTER — Encounter: Payer: Self-pay | Admitting: Genetic Counselor

## 2022-12-15 ENCOUNTER — Other Ambulatory Visit: Payer: Self-pay | Admitting: Genetic Counselor

## 2022-12-15 ENCOUNTER — Encounter: Payer: Self-pay | Admitting: Genetic Counselor

## 2022-12-15 ENCOUNTER — Inpatient Hospital Stay: Payer: Medicaid Other

## 2022-12-15 ENCOUNTER — Inpatient Hospital Stay: Payer: Medicaid Other | Admitting: Genetic Counselor

## 2022-12-15 ENCOUNTER — Other Ambulatory Visit: Payer: Self-pay

## 2022-12-15 DIAGNOSIS — Z803 Family history of malignant neoplasm of breast: Secondary | ICD-10-CM | POA: Insufficient documentation

## 2022-12-15 DIAGNOSIS — Z8049 Family history of malignant neoplasm of other genital organs: Secondary | ICD-10-CM | POA: Insufficient documentation

## 2022-12-15 DIAGNOSIS — N950 Postmenopausal bleeding: Secondary | ICD-10-CM

## 2022-12-15 DIAGNOSIS — C541 Malignant neoplasm of endometrium: Secondary | ICD-10-CM

## 2022-12-15 LAB — GENETIC SCREENING ORDER

## 2022-12-15 NOTE — Progress Notes (Addendum)
REFERRING PROVIDER: Carver Fila, MD [address] [place],  [place] [address]  PRIMARY PROVIDER:  Health, [facility]  PRIMARY REASON FOR VISIT:  1. Family history of breast cancer   2. Family history of uterine cancer   3. Endometrial cancer (HCC)      HISTORY OF PRESENT ILLNESS:   Allison [name], a 57 y.o. female, was seen for a [place] cancer genetics consultation at the request of Dr. Pricilla Holm due to a personal and family history of uterine cancer.  Allison [name] presents to clinic today to discuss the possibility of a hereditary predisposition to cancer, genetic testing, and to further clarify her future cancer risks, as well as potential cancer risks for family members.   In May 2024, at the age of 72, Allison [name] was diagnosed with uterine cancer.     CANCER HISTORY:  Oncology History   No history exists.     RISK FACTORS:  Menarche was at age 62.  First live birth at age 50.  OCP use for approximately 1 years.  Ovaries intact: yes.  Hysterectomy: no.  Menopausal status: perimenopausal.  HRT use: 0 years. Colonoscopy: yes;  multiple polyps . Mammogram within the last year: yes. Number of breast biopsies: 0. Up to date with pelvic exams: yes. Any excessive radiation exposure in the past: no  Past Medical History:  Diagnosis Date   Asthma    rare rescue inhaler use (not since 2021)   Borderline diabetes    Family history of breast cancer    Family history of uterine cancer    Hyperlipidemia    Hypertension    Obesity     Past Surgical History:  Procedure Laterality Date   CHOLECYSTECTOMY     COLONOSCOPY N/A 07/15/2014   Procedure: COLONOSCOPY;  Surgeon: West Bali, MD;  Location: [facility];  Service: Endoscopy;  Laterality: N/A;  9:30 AM - moved to 1:00 - Ginger to notify pt   REDUCTION MAMMAPLASTY Bilateral     Social History   Socioeconomic History   Marital status: Single    Spouse name: Not on file   Number of children: 3    Years of education: Not on file   Highest education level: High school graduate  Occupational History   Not on file  Tobacco Use   Smoking status: Former    Types: Cigarettes    Quit date: 2006    Years since quitting: 18.3   Smokeless tobacco: Never  Vaping Use   Vaping Use: Never used  Substance and Sexual Activity   Alcohol use: No   Drug use: No   Sexual activity: Not Currently    Birth control/protection: Post-menopausal  Other Topics Concern   Not on file  Social History Narrative   Not on file   Social Determinants of Health   Financial Resource Strain: High Risk (09/20/2022)   Overall Financial Resource Strain (CARDIA)    Difficulty of Paying Living Expenses: Hard  Food Insecurity: No Food Insecurity (09/23/2022)   Hunger Vital Sign    Worried About Running Out of Food in the Last Year: Never true    Ran Out of Food in the Last Year: Never true  Recent Concern: Food Insecurity - Food Insecurity Present (09/20/2022)   Hunger Vital Sign    Worried About Running Out of Food in the Last Year: Sometimes true    Ran Out of Food in the Last Year: Sometimes true  Transportation Needs: No Transportation Needs (  09/23/2022)   PRAPARE - Administrator, Civil Service (Medical): No    Lack of Transportation (Non-Medical): No  Physical Activity: Insufficiently Active (09/20/2022)   Exercise Vital Sign    Days of Exercise per Week: 2 days    Minutes of Exercise per Session: 10 min  Stress: Stress Concern Present (09/20/2022)   [employer] of Occupational Health - Occupational Stress Questionnaire    Feeling of Stress : To some extent  Social Connections: Socially Isolated (09/20/2022)   Social Connection and Isolation Panel [NHANES]    Frequency of Communication with Friends and Family: More than three times a week    Frequency of Social Gatherings with Friends and Family: Three times a week    Attends Religious Services: Never    Active Member of Clubs or  Organizations: No    Attends Banker Meetings: Never    Marital Status: Never married     FAMILY HISTORY:  We obtained a detailed, 4-generation family history.  Significant diagnoses are listed below: Family History  Problem Relation Age of Onset   Cervical cancer Mother    Fibrocystic breast disease Maternal Grandmother    Breast cancer Maternal Grandmother    Other Maternal Grandfather        MVA   Osteoporosis Daughter    Multiple sclerosis Daughter    Breast cancer Other        MGMs sister   Pancreatic cancer Other        MGMs brother   Diabetes Half-Sister    Heart disease Half-Sister    Lupus Half-Sister    Uterine cancer Half-Sister 76   Heart attack Half-Brother    Stroke Half-Brother    Diabetes Half-Brother     The patient has two daughters and a son who are cancer free.  She has 10 maternal half sisters and three maternal half brothers.  One sister was diagnosed with uterine cancer at 78.  Her mother is living and her biological father is living but she has no information on him.  The patient's mother had cervical cancer at 76.  She has two sisters and a brother who reportedly are cancer free.  The maternal grandmother had breast cancer in her 78s.  She had a brother with pancreatic cancer and a sister with breast cancer.  Allison [name] is unaware of previous family history of genetic testing for hereditary cancer risks. Patient's maternal ancestors are of African American descent. There is no reported Ashkenazi Jewish ancestry. There is no known consanguinity.  GENETIC COUNSELING ASSESSMENT: Allison [name] is a 57 y.o. 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 We, therefore, discussed and recommended the following at today's visit.   DISCUSSION: We discussed that, in  general, most cancer is not inherited in families, but instead is sporadic or familial. Sporadic cancers occur by chance and typically happen at older ages (>50 years) as this type of cancer is caused by genetic changes acquired during an individual's lifetime. Some families have more cancers than would be expected by chance; however, the ages or types of cancer are not consistent with a known genetic mutation or known genetic mutations have been ruled out. This type of familial cancer is thought to be due to a combination of multiple genetic, environmental, hormonal, and  lifestyle factors. While this combination of factors likely increases the risk of cancer, the exact source of this risk is not currently identifiable or testable.  We discussed that 3 - 5% of uterine cancer is hereditary, with most cases associated with Lynch syndrome.  There are other genes that can be associated with hereditary uterine cancer syndromes.  These include PTEN and other genes.  We discussed that testing is beneficial for several reasons including knowing how to follow individuals after completing their treatment, identifying whether potential treatment options such as PARP inhibitors would be beneficial, and understand if other family members could be at risk for cancer and allow them to undergo genetic testing.   We reviewed the characteristics, features and inheritance patterns of hereditary cancer syndromes. We also discussed genetic testing, including the appropriate family members to test, the process of testing, insurance coverage and turn-around-time for results. We discussed the implications of a negative, positive, carrier and/or variant of uncertain significant result. Allison [name]  was offered a common hereditary cancer panel (47 genes) and an expanded pan-cancer panel (77 genes). Allison [name] was informed of the benefits and limitations of each panel, including that expanded pan-cancer panels contain genes that do not have  clear management guidelines at this point in time.  We also discussed that as the number of genes included on a panel increases, the chances of variants of uncertain significance increases. Allison [name] decided to pursue genetic testing for the CancerNext-Expanded+RNAinsight gene panel.   The CancerNext-Expanded gene panel offered by [employer] and includes sequencing and rearrangement analysis for the following 71 genes: AIP, ALK, APC, ATM, BAP1, BARD1, BMPR1A, BRCA1, BRCA2, BRIP1, CDC73, CDH1, CDK4, CDKN1B, CDKN2A, CHEK2, DICER1, FH, FLCN, KIF1B, LZTR1, MAX, MEN1, MET, MLH1, MSH2, MSH6, MUTYH, NF1, NF2, NTHL1, PALB2, PHOX2B, PMS2, POT1, PRKAR1A, PTCH1, PTEN, RAD51C, RAD51D, RB1, RET, SDHA, SDHAF2, SDHB, SDHC, SDHD, SMAD4, SMARCA4, SMARCB1, SMARCE1, STK11, SUFU, TMEM127, TP53, TSC1, TSC2 and VHL (sequencing and deletion/duplication); AXIN2, CTNNA1, EGFR, EGLN1, HOXB13, KIT, MITF, MSH3, PDGFRA, POLD1 and POLE (sequencing only); EPCAM and GREM1 (deletion/duplication only). RNA data is routinely analyzed for use in variant interpretation for all genes.   Based on Allison [name] personal and family history of cancer, she meets medical criteria for genetic testing. Despite that she meets criteria, she may still have an out of pocket cost. We discussed that if her out of pocket cost for testing is over $100, the laboratory will call and confirm whether she wants to proceed with testing.  If the out of pocket cost of testing is less than $100 she will be billed by the genetic testing laboratory.   We discussed that some people do not want to undergo genetic testing due to fear of genetic discrimination.  The Genetic Information Nondiscrimination Act (GINA) was signed into federal law in 2008. GINA prohibits health insurers and most employers from discriminating against individuals based on genetic information (including the results of genetic tests and family history information). According to GINA, health insurance  companies cannot consider genetic information to be a preexisting condition, nor can they use it to make decisions regarding coverage or rates. GINA also makes it illegal for most employers to use genetic information in making decisions about hiring, firing, promotion, or terms of employment. It is important to note that GINA does not offer protections for life insurance, disability insurance, or long-term care insurance. GINA does not apply to those in the [employer], those who work for companies with less than 15 employees, and  new life insurance or long-term disability Medical illustrator.  Health status due to a cancer diagnosis is not protected under GINA. More information about GINA can be found by visiting [email or web address].   PLAN: After considering the risks, benefits, and limitations, Allison [name] provided informed consent to pursue genetic testing and the blood sample was sent to [facility] for analysis of the CancerNext-Expanded+RNAinsight. Results should be available within approximately 2-3 weeks' time, at which point they will be disclosed by telephone to Allison [name], as will any additional recommendations warranted by these results. Allison [name] will receive a summary of her genetic counseling visit and a copy of her results once available. This information will also be available in Epic.  Lastly, we encouraged Allison [name] to remain in contact with cancer genetics annually so that we can continuously update the family history and inform her of any changes in cancer genetics and testing that may be of benefit for this family.   Allison [name] questions were answered to her satisfaction today. Our contact information was provided should additional questions or concerns arise. Thank you for the referral and allowing [place] to share in the care of your patient.   Allison Garlington P. Lowell Guitar, MS, [facility] Licensed, Patent attorney [email or web address] phone: [phone]  The patient was seen for a  total of 35 minutes in face-to-face genetic counseling.  The patient was seen alone.  Drs. Meliton Rattan, and/or Monona were available for questions, if needed..    _______________________________________________________________________ For Office Staff:  Number of people involved in session: 1 Was an Intern/ student involved with case: no

## 2022-12-23 NOTE — Patient Instructions (Signed)
DUE TO COVID-19 ONLY TWO VISITORS  (aged 59 and older)  ARE ALLOWED TO COME WITH YOU AND STAY IN THE WAITING ROOM ONLY DURING PRE OP AND PROCEDURE.   **NO VISITORS ARE ALLOWED IN THE SHORT STAY AREA OR RECOVERY ROOM!!**  IF YOU WILL BE ADMITTED INTO THE HOSPITAL YOU ARE ALLOWED ONLY FOUR SUPPORT PEOPLE DURING VISITATION HOURS ONLY (7 AM -8PM)   The support person(s) must pass our screening, gel in and out, and wear a mask at all times, including in the patient's room. Patients must also wear a mask when staff or their support person are in the room. Visitors GUEST BADGE MUST BE WORN VISIBLY  One adult visitor may remain with you overnight and MUST be in the room by 8 P.M.     Your procedure is scheduled on: 12/29/22   Report to [facility] Main Entrance    Report to admitting at : 9:30 AM   Call this number if you have problems the morning of surgery [phone]   Eat a light diet the day before surgery.  Examples including soups, broths, toast, yogurt, mashed potatoes.  Things to avoid include carbonated beverages (fizzy beverages), raw fruits and raw vegetables, or beans.   If your bowels are filled with gas, your surgeon will have difficulty visualizing your pelvic organs which increases your surgical risks. Do not eat food :After Midnight.   After Midnight you may have the following liquids until : 8:30 AM DAY OF SURGERY  Water Black Coffee (sugar ok, NO MILK/CREAM OR CREAMERS)  Tea (sugar ok, NO MILK/CREAM OR CREAMERS) regular and decaf                             Plain Jell-O (NO RED)                                           Fruit ices (not with fruit pulp, NO RED)                                     Popsicles (NO RED)                                                                  Juice: apple, WHITE grape, WHITE cranberry Sports drinks like Gatorade (NO RED)          Oral Hygiene is also important to reduce your risk of infection.                                     Remember - BRUSH YOUR TEETH THE MORNING OF SURGERY WITH YOUR REGULAR TOOTHPASTE  DENTURES WILL BE REMOVED PRIOR TO SURGERY PLEASE DO NOT APPLY "Poly grip" OR ADHESIVES!!!   Do NOT smoke after Midnight   Take these medicines the morning of surgery with A SIP OF WATER: Tramadol as needed.Inhalers as usual.  DO NOT TAKE ANY ORAL DIABETIC MEDICATIONS DAY OF YOUR SURGERY  You may not have any metal on your body including hair pins, jewelry, and body piercing             Do not wear make-up, lotions, powders, perfumes/cologne, or deodorant  Do not wear nail polish including gel and S&S, artificial/acrylic nails, or any other type of covering on natural nails including finger and toenails. If you have artificial nails, gel coating, etc. that needs to be removed by a nail salon please have this removed prior to surgery or surgery may need to be canceled/ delayed if the surgeon/ anesthesia feels like they are unable to be safely monitored.   Do not shave  48 hours prior to surgery.   Do not bring valuables to the hospital. [facility] IS NOT             RESPONSIBLE   FOR VALUABLES.   Contacts, glasses, or bridgework may not be worn into surgery.   Bring small overnight bag day of surgery.   DO NOT BRING YOUR HOME MEDICATIONS TO THE HOSPITAL. PHARMACY WILL DISPENSE MEDICATIONS LISTED ON YOUR MEDICATION LIST TO YOU DURING YOUR ADMISSION IN THE HOSPITAL!    Patients discharged on the day of surgery will not be allowed to drive home.  Someone NEEDS to stay with you for the first 24 hours after anesthesia.   Special Instructions: Bring a copy of your healthcare power of attorney and living will documents         the day of surgery if you haven't scanned them before.              Please read over the following fact sheets you were given: IF YOU HAVE QUESTIONS ABOUT YOUR PRE-OP INSTRUCTIONS PLEASE CALL [phone]    [facility] - Preparing for Surgery Before  surgery, you can play an important role.  Because skin is not sterile, your skin needs to be as free of germs as possible.  You can reduce the number of germs on your skin by washing with CHG (chlorahexidine gluconate) soap before surgery.  CHG is an antiseptic cleaner which kills germs and bonds with the skin to continue killing germs even after washing. Please DO NOT use if you have an allergy to CHG or antibacterial soaps.  If your skin becomes reddened/irritated stop using the CHG and inform your nurse when you arrive at Short Stay. Do not shave (including legs and underarms) for at least 48 hours prior to the first CHG shower.  You may shave your face/neck. Please follow these instructions carefully:  1.  Shower with CHG Soap the night before surgery and the  morning of Surgery.  2.  If you choose to wash your hair, wash your hair first as usual with your  normal  shampoo.  3.  After you shampoo, rinse your hair and body thoroughly to remove the  shampoo.                           4.  Use CHG as you would any other liquid soap.  You can apply chg directly  to the skin and wash                       Gently with a scrungie or clean washcloth.  5.  Apply the CHG Soap to your body ONLY FROM THE NECK DOWN.   Do not use on face/ open  Wound or open sores. Avoid contact with eyes, ears mouth and genitals (private parts).                       Wash face,  Genitals (private parts) with your normal soap.             6.  Wash thoroughly, paying special attention to the area where your surgery  will be performed.  7.  Thoroughly rinse your body with warm water from the neck down.  8.  DO NOT shower/wash with your normal soap after using and rinsing off  the CHG Soap.                9.  Pat yourself dry with a clean towel.            10.  Wear clean pajamas.            11.  Place clean sheets on your bed the night of your first shower and do not  sleep with pets. Day of Surgery  : Do not apply any lotions/deodorants the morning of surgery.  Please wear clean clothes to the hospital/surgery center.  FAILURE TO FOLLOW THESE INSTRUCTIONS MAY RESULT IN THE CANCELLATION OF YOUR SURGERY PATIENT SIGNATURE_________________________________  NURSE SIGNATURE__________________________________  ________________________________________________________________________

## 2022-12-27 ENCOUNTER — Encounter (HOSPITAL_COMMUNITY): Payer: Self-pay

## 2022-12-27 ENCOUNTER — Other Ambulatory Visit: Payer: Self-pay

## 2022-12-27 ENCOUNTER — Encounter (HOSPITAL_COMMUNITY)
Admission: RE | Admit: 2022-12-27 | Discharge: 2022-12-27 | Disposition: A | Discharge status: Home / Self Care | Payer: Medicaid Other | Source: Ambulatory Visit | Attending: Gynecologic Oncology | Admitting: Gynecologic Oncology

## 2022-12-27 VITALS — BP 155/94 | HR 95 | Temp 97.7°F | Ht 62.0 in | Wt 248.0 lb

## 2022-12-27 DIAGNOSIS — R7303 Prediabetes: Secondary | ICD-10-CM | POA: Diagnosis not present

## 2022-12-27 DIAGNOSIS — I2510 Atherosclerotic heart disease of native coronary artery without angina pectoris: Secondary | ICD-10-CM

## 2022-12-27 DIAGNOSIS — I10 Essential (primary) hypertension: Secondary | ICD-10-CM | POA: Diagnosis not present

## 2022-12-27 DIAGNOSIS — Z01818 Encounter for other preprocedural examination: Secondary | ICD-10-CM | POA: Diagnosis not present

## 2022-12-27 DIAGNOSIS — C541 Malignant neoplasm of endometrium: Secondary | ICD-10-CM

## 2022-12-27 HISTORY — DX: Bronchitis, not specified as acute or chronic: J40

## 2022-12-27 HISTORY — DX: Unspecified osteoarthritis, unspecified site: M19.90

## 2022-12-27 HISTORY — DX: Prediabetes: R73.03

## 2022-12-27 HISTORY — DX: Malignant (primary) neoplasm, unspecified: C80.1

## 2022-12-27 LAB — COMPREHENSIVE METABOLIC PANEL
ALT: 31 U/L (ref 0–44)
AST: 18 U/L (ref 15–41)
Albumin: 3.7 g/dL (ref 3.5–5.0)
Alkaline Phosphatase: 37 U/L — ABNORMAL LOW (ref 38–126)
Anion gap: 8 (ref 5–15)
BUN: 13 mg/dL (ref 6–20)
CO2: 26 mmol/L (ref 22–32)
Calcium: 8.9 mg/dL (ref 8.9–10.3)
Chloride: 103 mmol/L (ref 98–111)
Creatinine, Ser: 0.87 mg/dL (ref 0.44–1.00)
GFR, Estimated: >60 mL/min (ref >60)
Glucose, Bld: 99 mg/dL (ref 70–99)
Potassium: 3.8 mmol/L (ref 3.5–5.1)
Sodium: 137 mmol/L (ref 135–145)
Total Bilirubin: 0.4 mg/dL (ref 0.3–1.2)
Total Protein: 7.7 g/dL (ref 6.5–8.1)

## 2022-12-27 LAB — CBC
HCT: 37.2 % (ref 36.0–46.0)
Hemoglobin: 12.1 g/dL (ref 12.0–15.0)
MCH: 29.5 pg (ref 26.0–34.0)
MCHC: 32.5 g/dL (ref 30.0–36.0)
MCV: 90.7 fL (ref 80.0–100.0)
Platelets: 488 10*3/uL — ABNORMAL HIGH (ref 150–400)
RBC: 4.1 MIL/uL (ref 3.87–5.11)
RDW: 15.1 % (ref 11.5–15.5)
WBC: 8.6 10*3/uL (ref 4.0–10.5)
nRBC: 0 % (ref 0.0–0.2)

## 2022-12-27 LAB — GLUCOSE, CAPILLARY: Glucose-Capillary: 111 mg/dL — ABNORMAL HIGH (ref 70–99)

## 2022-12-27 NOTE — Progress Notes (Signed)
For Short Stay: COVID SWAB appointment date:  Bowel Prep reminder:   For Anesthesia: PCP - [facility] Cardiologist - N/A  Chest x-ray -  EKG -  Stress Test -  ECHO -  Cardiac Cath -  Pacemaker/ICD device last checked: Pacemaker orders received: Device Rep notified:  Spinal Cord Stimulator: N/A  Sleep Study - NO CPAP - NO  Fasting Blood Sugar - N/A Checks Blood Sugar ___0__ times a day Date and result of last Hgb A1c-  Last dose of GLP1 agonist- N/A GLP1 instructions:   Last dose of SGLT-2 inhibitors- N/A SGLT-2 instructions:   Blood Thinner Instructions: Aspirin Instructions: On Hold since 2 weeks ago. Last Dose:  Activity level: Can go up a flight of stairs and activities of daily living without stopping and without chest pain and/or shortness of breath   Able to exercise without chest pain and/or shortness of breath  Anesthesia review: Hx: HTN,Pre-DIA.  Patient denies shortness of breath, fever, cough and chest pain at PAT appointment   Patient verbalized understanding of instructions that were given to them at the PAT appointment. Patient was also instructed that they will need to review over the PAT instructions again at home before surgery.

## 2022-12-27 NOTE — Progress Notes (Signed)
   12/27/22 1354  OBSTRUCTIVE SLEEP APNEA  Have you ever been diagnosed with sleep apnea through a sleep study? No  Do you snore loudly (loud enough to be heard through closed doors)?  1  Do you often feel tired, fatigued, or sleepy during the daytime (such as falling asleep during driving or talking to someone)? 1  Has anyone observed you stop breathing during your sleep? 1  Do you have, or are you being treated for high blood pressure? 1  BMI more than 35 kg/m2? 1  Age > 50 (1-yes) 1  Neck circumference greater than:Female 16 inches or larger, Female 17inches or larger? 1  Female Gender (Yes=1) 0  Obstructive Sleep Apnea Score 7  Score 5 or greater  Results sent to PCP

## 2022-12-28 ENCOUNTER — Telehealth: Payer: Self-pay

## 2022-12-28 LAB — HEMOGLOBIN A1C
Hgb A1c MFr Bld: 6.3 % — ABNORMAL HIGH (ref 4.8–5.6)
Mean Plasma Glucose: 134 mg/dL

## 2022-12-28 NOTE — Telephone Encounter (Signed)
Telephone call to check on pre-operative status. LVM for return call.

## 2022-12-29 ENCOUNTER — Encounter (HOSPITAL_COMMUNITY)
Admission: RE | Disposition: A | Discharge status: Home / Self Care | Payer: Self-pay | Source: Home / Self Care | Attending: Gynecologic Oncology

## 2022-12-29 ENCOUNTER — Ambulatory Visit (HOSPITAL_COMMUNITY): Payer: Medicaid Other | Admitting: Anesthesiology

## 2022-12-29 ENCOUNTER — Ambulatory Visit (HOSPITAL_COMMUNITY)
Admission: RE | Admit: 2022-12-29 | Discharge: 2022-12-29 | Disposition: A | Discharge status: Home / Self Care | Payer: Medicaid Other | Attending: Gynecologic Oncology | Admitting: Gynecologic Oncology

## 2022-12-29 ENCOUNTER — Other Ambulatory Visit: Payer: Self-pay

## 2022-12-29 ENCOUNTER — Encounter (HOSPITAL_COMMUNITY): Payer: Self-pay | Admitting: Gynecologic Oncology

## 2022-12-29 ENCOUNTER — Ambulatory Visit (HOSPITAL_BASED_OUTPATIENT_CLINIC_OR_DEPARTMENT_OTHER): Payer: Medicaid Other | Admitting: Anesthesiology

## 2022-12-29 DIAGNOSIS — C541 Malignant neoplasm of endometrium: Secondary | ICD-10-CM

## 2022-12-29 DIAGNOSIS — D251 Intramural leiomyoma of uterus: Secondary | ICD-10-CM | POA: Diagnosis not present

## 2022-12-29 DIAGNOSIS — I10 Essential (primary) hypertension: Secondary | ICD-10-CM | POA: Insufficient documentation

## 2022-12-29 DIAGNOSIS — Z79899 Other long term (current) drug therapy: Secondary | ICD-10-CM | POA: Insufficient documentation

## 2022-12-29 DIAGNOSIS — N736 Female pelvic peritoneal adhesions (postinfective): Secondary | ICD-10-CM | POA: Diagnosis not present

## 2022-12-29 DIAGNOSIS — R7303 Prediabetes: Secondary | ICD-10-CM | POA: Diagnosis not present

## 2022-12-29 DIAGNOSIS — J45909 Unspecified asthma, uncomplicated: Secondary | ICD-10-CM | POA: Insufficient documentation

## 2022-12-29 DIAGNOSIS — Z7984 Long term (current) use of oral hypoglycemic drugs: Secondary | ICD-10-CM | POA: Diagnosis not present

## 2022-12-29 DIAGNOSIS — Z87891 Personal history of nicotine dependence: Secondary | ICD-10-CM | POA: Insufficient documentation

## 2022-12-29 DIAGNOSIS — Z6841 Body Mass Index (BMI) 40.0 and over, adult: Secondary | ICD-10-CM | POA: Diagnosis not present

## 2022-12-29 DIAGNOSIS — N888 Other specified noninflammatory disorders of cervix uteri: Secondary | ICD-10-CM | POA: Insufficient documentation

## 2022-12-29 HISTORY — PX: LYMPH NODE DISSECTION: SHX5087

## 2022-12-29 HISTORY — PX: SENTINEL NODE BIOPSY: SHX6608

## 2022-12-29 HISTORY — PX: ROBOTIC ASSISTED TOTAL HYSTERECTOMY WITH BILATERAL SALPINGO OOPHERECTOMY: SHX6086

## 2022-12-29 LAB — TYPE AND SCREEN
ABO/RH(D): A POS
Antibody Screen: NEGATIVE

## 2022-12-29 LAB — ABO/RH: ABO/RH(D): A POS

## 2022-12-29 LAB — GLUCOSE, CAPILLARY: Glucose-Capillary: 97 mg/dL (ref 70–99)

## 2022-12-29 SURGERY — HYSTERECTOMY, TOTAL, ROBOT-ASSISTED, LAPAROSCOPIC, WITH BILATERAL SALPINGO-OOPHORECTOMY
Anesthesia: General

## 2022-12-29 MED ORDER — HEPARIN SODIUM (PORCINE) 5000 UNIT/ML IJ SOLN
5000.0000 [IU] | INTRAMUSCULAR | Status: AC
  Administered 2022-12-29: 5000 [IU] via SUBCUTANEOUS
  Filled 2022-12-29: qty 1

## 2022-12-29 MED ORDER — STERILE WATER FOR INJECTION IJ SOLN
INTRAMUSCULAR | Status: DC | PRN
  Administered 2022-12-29: 10 mL

## 2022-12-29 MED ORDER — ORAL CARE MOUTH RINSE: 15.0000 mL | Freq: Once | OROMUCOSAL | Status: AC

## 2022-12-29 MED ORDER — ONDANSETRON HCL 4 MG/2ML IJ SOLN
INTRAMUSCULAR | Status: AC
  Filled 2022-12-29: qty 2

## 2022-12-29 MED ORDER — LIDOCAINE HCL (PF) 2 % IJ SOLN
INTRAMUSCULAR | Status: AC
  Filled 2022-12-29: qty 5

## 2022-12-29 MED ORDER — DEXAMETHASONE SODIUM PHOSPHATE 10 MG/ML IJ SOLN
INTRAMUSCULAR | Status: AC
  Filled 2022-12-29: qty 1

## 2022-12-29 MED ORDER — CEFAZOLIN SODIUM-DEXTROSE 2-4 GM/100ML-% IV SOLN
2.0000 g | INTRAVENOUS | Status: AC
  Administered 2022-12-29: 2 g via INTRAVENOUS
  Filled 2022-12-29: qty 100

## 2022-12-29 MED ORDER — SCOPOLAMINE 1 MG/3DAYS TD PT72
1.0000 | MEDICATED_PATCH | TRANSDERMAL | Status: DC
  Administered 2022-12-29: 1.5 mg via TRANSDERMAL
  Filled 2022-12-29: qty 1

## 2022-12-29 MED ORDER — LACTATED RINGERS IR SOLN
Status: DC | PRN
  Administered 2022-12-29: 1000 mL

## 2022-12-29 MED ORDER — LIDOCAINE HCL (PF) 2 % IJ SOLN
INTRAMUSCULAR | Status: AC
  Filled 2022-12-29: qty 15

## 2022-12-29 MED ORDER — PROPOFOL 10 MG/ML IV BOLUS
INTRAVENOUS | Status: AC
  Filled 2022-12-29: qty 20

## 2022-12-29 MED ORDER — AMISULPRIDE (ANTIEMETIC) 5 MG/2ML IV SOLN
10.0000 mg | Freq: Once | INTRAVENOUS | Status: AC | PRN
  Administered 2022-12-29: 10 mg via INTRAVENOUS

## 2022-12-29 MED ORDER — FENTANYL CITRATE (PF) 250 MCG/5ML IJ SOLN
INTRAMUSCULAR | Status: AC
  Filled 2022-12-29: qty 5

## 2022-12-29 MED ORDER — FENTANYL CITRATE PF 50 MCG/ML IJ SOSY
PREFILLED_SYRINGE | INTRAMUSCULAR | Status: AC
  Filled 2022-12-29: qty 2

## 2022-12-29 MED ORDER — INDOCYANINE GREEN 25 MG IV SOLR
INTRAVENOUS | Status: AC
  Filled 2022-12-29: qty 10

## 2022-12-29 MED ORDER — ACETAMINOPHEN 500 MG PO TABS
1000.0000 mg | ORAL_TABLET | Freq: Once | ORAL | Status: AC
  Administered 2022-12-29: 1000 mg via ORAL
  Filled 2022-12-29: qty 2

## 2022-12-29 MED ORDER — LACTATED RINGERS IV SOLN: INTRAVENOUS | Status: DC | PRN

## 2022-12-29 MED ORDER — KETAMINE HCL 50 MG/5ML IJ SOSY
PREFILLED_SYRINGE | INTRAMUSCULAR | Status: AC
  Filled 2022-12-29: qty 5

## 2022-12-29 MED ORDER — STERILE WATER FOR INJECTION IJ SOLN
INTRAMUSCULAR | Status: DC | PRN
  Administered 2022-12-29: 4 mL

## 2022-12-29 MED ORDER — PHENYLEPHRINE 80 MCG/ML (10ML) SYRINGE FOR IV PUSH (FOR BLOOD PRESSURE SUPPORT)
PREFILLED_SYRINGE | INTRAVENOUS | Status: DC | PRN
  Administered 2022-12-29: 160 ug via INTRAVENOUS
  Administered 2022-12-29: 80 ug via INTRAVENOUS

## 2022-12-29 MED ORDER — CHLORHEXIDINE GLUCONATE 0.12 % MT SOLN
15.0000 mL | Freq: Once | OROMUCOSAL | Status: AC
  Administered 2022-12-29: 15 mL via OROMUCOSAL

## 2022-12-29 MED ORDER — ROCURONIUM BROMIDE 10 MG/ML (PF) SYRINGE
PREFILLED_SYRINGE | INTRAVENOUS | Status: AC
  Filled 2022-12-29: qty 10

## 2022-12-29 MED ORDER — KETAMINE HCL 10 MG/ML IJ SOLN
INTRAMUSCULAR | Status: DC | PRN
  Administered 2022-12-29: 25 mg via INTRAVENOUS

## 2022-12-29 MED ORDER — FENTANYL CITRATE (PF) 100 MCG/2ML IJ SOLN
INTRAMUSCULAR | Status: AC
  Filled 2022-12-29: qty 2

## 2022-12-29 MED ORDER — LACTATED RINGERS IV SOLN: INTRAVENOUS | Status: DC

## 2022-12-29 MED ORDER — ONDANSETRON HCL 4 MG/2ML IJ SOLN
INTRAMUSCULAR | Status: DC | PRN
  Administered 2022-12-29: 4 mg via INTRAVENOUS

## 2022-12-29 MED ORDER — PROPOFOL 10 MG/ML IV BOLUS
INTRAVENOUS | Status: DC | PRN
  Administered 2022-12-29: 160 mg via INTRAVENOUS

## 2022-12-29 MED ORDER — FENTANYL CITRATE (PF) 250 MCG/5ML IJ SOLN
INTRAMUSCULAR | Status: DC | PRN
  Administered 2022-12-29: 50 ug via INTRAVENOUS
  Administered 2022-12-29 (×2): 100 ug via INTRAVENOUS
  Administered 2022-12-29: 50 ug via INTRAVENOUS

## 2022-12-29 MED ORDER — ACETAMINOPHEN 500 MG PO TABS
1000.0000 mg | ORAL_TABLET | ORAL | Status: DC
Start: 2022-12-29 — End: 2022-12-29

## 2022-12-29 MED ORDER — METRONIDAZOLE 500 MG/100ML IV SOLN
500.0000 mg | INTRAVENOUS | Status: AC
  Administered 2022-12-29: 500 mg via INTRAVENOUS
  Filled 2022-12-29: qty 100

## 2022-12-29 MED ORDER — PROMETHAZINE HCL 25 MG/ML IJ SOLN: 6.2500 mg | INTRAMUSCULAR | Status: DC | PRN

## 2022-12-29 MED ORDER — SUGAMMADEX SODIUM 200 MG/2ML IV SOLN
INTRAVENOUS | Status: DC | PRN
  Administered 2022-12-29: 300 mg via INTRAVENOUS

## 2022-12-29 MED ORDER — GABAPENTIN 300 MG PO CAPS
300.0000 mg | ORAL_CAPSULE | ORAL | Status: AC
  Administered 2022-12-29: 300 mg via ORAL
  Filled 2022-12-29: qty 1

## 2022-12-29 MED ORDER — ROCURONIUM BROMIDE 10 MG/ML (PF) SYRINGE
PREFILLED_SYRINGE | INTRAVENOUS | Status: DC | PRN
  Administered 2022-12-29: 10 mg via INTRAVENOUS
  Administered 2022-12-29: 100 mg via INTRAVENOUS
  Administered 2022-12-29: 20 mg via INTRAVENOUS

## 2022-12-29 MED ORDER — LIDOCAINE 20MG/ML (2%) 15 ML SYRINGE OPTIME
INTRAMUSCULAR | Status: DC | PRN
  Administered 2022-12-29: 1.5 mg/kg/h via INTRAVENOUS

## 2022-12-29 MED ORDER — DEXAMETHASONE SODIUM PHOSPHATE 4 MG/ML IJ SOLN
4.0000 mg | INTRAMUSCULAR | Status: AC
  Administered 2022-12-29: 4 mg via INTRAVENOUS

## 2022-12-29 MED ORDER — LIDOCAINE 2% (20 MG/ML) 5 ML SYRINGE
INTRAMUSCULAR | Status: DC | PRN
  Administered 2022-12-29: 60 mg via INTRAVENOUS

## 2022-12-29 MED ORDER — STERILE WATER FOR INJECTION IJ SOLN
INTRAMUSCULAR | Status: AC
  Filled 2022-12-29: qty 10

## 2022-12-29 MED ORDER — FENTANYL CITRATE PF 50 MCG/ML IJ SOSY
25.0000 ug | PREFILLED_SYRINGE | INTRAMUSCULAR | Status: DC | PRN
  Administered 2022-12-29: 50 ug via INTRAVENOUS

## 2022-12-29 MED ORDER — MIDAZOLAM HCL 2 MG/2ML IJ SOLN
INTRAMUSCULAR | Status: DC | PRN
  Administered 2022-12-29: 2 mg via INTRAVENOUS

## 2022-12-29 MED ORDER — CELECOXIB 200 MG PO CAPS
200.0000 mg | ORAL_CAPSULE | ORAL | Status: AC
  Administered 2022-12-29: 200 mg via ORAL
  Filled 2022-12-29: qty 1

## 2022-12-29 MED ORDER — SCOPOLAMINE 1 MG/3DAYS TD PT72
1.0000 | MEDICATED_PATCH | TRANSDERMAL | Status: DC
Start: 2022-12-29 — End: 2022-12-29

## 2022-12-29 MED ORDER — BUPIVACAINE HCL 0.25 % IJ SOLN
INTRAMUSCULAR | Status: AC
  Filled 2022-12-29: qty 1

## 2022-12-29 MED ORDER — BUPIVACAINE HCL 0.25 % IJ SOLN
INTRAMUSCULAR | Status: DC | PRN
  Administered 2022-12-29: 30 mL

## 2022-12-29 MED ORDER — MIDAZOLAM HCL 2 MG/2ML IJ SOLN
INTRAMUSCULAR | Status: AC
  Filled 2022-12-29: qty 2

## 2022-12-29 MED ORDER — STERILE WATER FOR IRRIGATION IR SOLN
Status: DC | PRN
  Administered 2022-12-29: 1000 mL

## 2022-12-29 MED ORDER — AMISULPRIDE (ANTIEMETIC) 5 MG/2ML IV SOLN
INTRAVENOUS | Status: AC
  Filled 2022-12-29: qty 4

## 2022-12-29 SURGICAL SUPPLY — 79 items
ADH SKN CLS APL DERMABOND .7 (GAUZE/BANDAGES/DRESSINGS) ×1
AGENT HMST KT MTR STRL THRMB (HEMOSTASIS)
APL ESCP 34 STRL LF DISP (HEMOSTASIS)
APPLICATOR SURGIFLO ENDO (HEMOSTASIS) IMPLANT
BAG COUNTER SPONGE SURGICOUNT (BAG) IMPLANT
BAG LAPAROSCOPIC 12 15 PORT 16 (BASKET) IMPLANT
BAG RETRIEVAL 12/15 (BASKET) ×1
BAG SPNG CNTER NS LX DISP (BAG)
BLADE SURG SZ10 CARB STEEL (BLADE) IMPLANT
COVER BACK TABLE 60X90IN (DRAPES) ×1 IMPLANT
COVER TIP SHEARS 8 DVNC (MISCELLANEOUS) ×1 IMPLANT
DERMABOND ADVANCED .7 DNX12 (GAUZE/BANDAGES/DRESSINGS) ×1 IMPLANT
DRAPE ARM DVNC X/XI (DISPOSABLE) ×4 IMPLANT
DRAPE COLUMN DVNC XI (DISPOSABLE) ×1 IMPLANT
DRAPE SHEET LG 3/4 BI-LAMINATE (DRAPES) ×1 IMPLANT
DRAPE SURG IRRIG POUCH 19X23 (DRAPES) ×1 IMPLANT
DRIVER NDL MEGA SUTCUT DVNCXI (INSTRUMENTS) ×1 IMPLANT
DRIVER NDLE MEGA SUTCUT DVNCXI (INSTRUMENTS) ×1 IMPLANT
DRSG OPSITE POSTOP 4X6 (GAUZE/BANDAGES/DRESSINGS) IMPLANT
DRSG OPSITE POSTOP 4X8 (GAUZE/BANDAGES/DRESSINGS) IMPLANT
ELECT PENCIL ROCKER SW 15FT (MISCELLANEOUS) IMPLANT
ELECT REM PT RETURN 15FT ADLT (MISCELLANEOUS) ×1 IMPLANT
FORCEPS BPLR FENES DVNC XI (FORCEP) ×1 IMPLANT
FORCEPS PROGRASP DVNC XI (FORCEP) ×1 IMPLANT
GAUZE 4X4 16PLY ~~LOC~~+RFID DBL (SPONGE) ×2 IMPLANT
GLOVE BIO SURGEON STRL SZ 6 (GLOVE) ×4 IMPLANT
GLOVE BIO SURGEON STRL SZ 6.5 (GLOVE) ×1 IMPLANT
GOWN STRL REUS W/ TWL LRG LVL3 (GOWN DISPOSABLE) ×4 IMPLANT
GOWN STRL REUS W/TWL LRG LVL3 (GOWN DISPOSABLE) ×5
GRASPER SUT TROCAR 14GX15 (MISCELLANEOUS) IMPLANT
HOLDER FOLEY CATH W/STRAP (MISCELLANEOUS) IMPLANT
IRRIG SUCT STRYKERFLOW 2 WTIP (MISCELLANEOUS) ×1
IRRIGATION SUCT STRKRFLW 2 WTP (MISCELLANEOUS) ×1 IMPLANT
KIT PROCEDURE DVNC SI (MISCELLANEOUS) IMPLANT
KIT TURNOVER KIT A (KITS) IMPLANT
LIGASURE IMPACT 36 18CM CVD LR (INSTRUMENTS) IMPLANT
MANIPULATOR ADVINCU DEL 3.0 PL (MISCELLANEOUS) IMPLANT
MANIPULATOR ADVINCU DEL 3.5 PL (MISCELLANEOUS) IMPLANT
MANIPULATOR UTERINE 4.5 ZUMI (MISCELLANEOUS) IMPLANT
NDL HYPO 21X1.5 SAFETY (NEEDLE) ×1 IMPLANT
NDL SPNL 18GX3.5 QUINCKE PK (NEEDLE) IMPLANT
NEEDLE HYPO 21X1.5 SAFETY (NEEDLE) ×1 IMPLANT
NEEDLE SPNL 18GX3.5 QUINCKE PK (NEEDLE) ×1 IMPLANT
OBTURATOR OPTICAL STND 8 DVNC (TROCAR) ×1
OBTURATOR OPTICALSTD 8 DVNC (TROCAR) ×1 IMPLANT
PACK ROBOT GYN CUSTOM WL (TRAY / TRAY PROCEDURE) ×1 IMPLANT
PAD POSITIONING PINK XL (MISCELLANEOUS) ×1 IMPLANT
PORT ACCESS TROCAR AIRSEAL 12 (TROCAR) IMPLANT
SCISSORS MNPLR CVD DVNC XI (INSTRUMENTS) ×1 IMPLANT
SCRUB CHG 4% DYNA-HEX 4OZ (MISCELLANEOUS) IMPLANT
SEAL UNIV 5-12 XI (MISCELLANEOUS) ×4 IMPLANT
SET TRI-LUMEN FLTR TB AIRSEAL (TUBING) ×1 IMPLANT
SPIKE FLUID TRANSFER (MISCELLANEOUS) ×1 IMPLANT
SPONGE T-LAP 18X18 ~~LOC~~+RFID (SPONGE) IMPLANT
SURGIFLO W/THROMBIN 8M KIT (HEMOSTASIS) IMPLANT
SUT MNCRL AB 4-0 PS2 18 (SUTURE) IMPLANT
SUT PDS AB 1 TP1 96 (SUTURE) IMPLANT
SUT V-LOC 180 0-0 GS22 (SUTURE) IMPLANT
SUT VIC AB 0 CT1 27 (SUTURE)
SUT VIC AB 0 CT1 27XBRD ANTBC (SUTURE) IMPLANT
SUT VIC AB 2-0 CT1 27 (SUTURE)
SUT VIC AB 2-0 CT1 TAPERPNT 27 (SUTURE) IMPLANT
SUT VIC AB 4-0 PS2 18 (SUTURE) ×2 IMPLANT
SUT VICRYL 0 27 CT2 27 ABS (SUTURE) ×1 IMPLANT
SUT VLOC 180 0 9IN  GS21 (SUTURE) ×1
SUT VLOC 180 0 9IN GS21 (SUTURE) IMPLANT
SYR 10ML LL (SYRINGE) IMPLANT
SYR BULB IRRIG 60ML STRL (SYRINGE) IMPLANT
SYS BAG RETRIEVAL 10MM (BASKET) ×1
SYS WOUND ALEXIS 18CM MED (MISCELLANEOUS)
SYSTEM BAG RETRIEVAL 10MM (BASKET) IMPLANT
SYSTEM WOUND ALEXIS 18CM MED (MISCELLANEOUS) IMPLANT
TOWEL OR NON WOVEN STRL DISP B (DISPOSABLE) IMPLANT
TRAP SPECIMEN MUCUS 40CC (MISCELLANEOUS) IMPLANT
TRAY FOLEY MTR SLVR 16FR STAT (SET/KITS/TRAYS/PACK) ×1 IMPLANT
TROCAR PORT AIRSEAL 5X120 (TROCAR) IMPLANT
UNDERPAD 30X36 HEAVY ABSORB (UNDERPADS AND DIAPERS) ×2 IMPLANT
WATER STERILE IRR 1000ML POUR (IV SOLUTION) ×1 IMPLANT
YANKAUER SUCT BULB TIP 10FT TU (MISCELLANEOUS) IMPLANT

## 2022-12-29 NOTE — Anesthesia Postprocedure Evaluation (Signed)
Anesthesia Post Note  Patient: Allison [name]  Procedure(s) Performed: XI ROBOTIC ASSISTED TOTAL HYSTERECTOMY WITH BILATERAL SALPINGO OOPHORECTOMY SENTINEL NODE BIOPSY LYMPH NODE DISSECTION     Patient location during evaluation: PACU Anesthesia Type: General Level of consciousness: sedated Pain management: pain level controlled Vital Signs Assessment: post-procedure vital signs reviewed and stable Respiratory status: spontaneous breathing and respiratory function stable Cardiovascular status: stable Postop Assessment: no apparent nausea or vomiting Anesthetic complications: no  No notable events documented.  Last Vitals:  Vitals:   12/29/22 1445 12/29/22 1455  BP:  (!) 146/84  Pulse: 83 79  Resp: (!) 22 (!) 23  Temp:    SpO2: 96% 97%    Last Pain:  Vitals:   12/29/22 1443  TempSrc:   PainSc: 7                  Rissie Sculley DANIEL

## 2022-12-29 NOTE — Interval H&P Note (Signed)
History and Physical Interval Note:  12/29/2022 10:34 AM  Allison [name]  has presented today for surgery, with the diagnosis of ENDOMETRIAL CANCEER.  The various methods of treatment have been discussed with the patient and family. After consideration of risks, benefits and other options for treatment, the patient has consented to  Procedure(s): XI ROBOTIC ASSISTED TOTAL HYSTERECTOMY WITH BILATERAL SALPINGO OOPHORECTOMY (N/A) SENTINEL NODE BIOPSY (N/A) POSSIBLE LYMPH NODE DISSECTION (N/A) POSSIBLE LAPAROTOMY (N/A) as a surgical intervention.  The patient's history has been reviewed, patient examined, no change in status, stable for surgery.  I have reviewed the patient's chart and labs.  Questions were answered to the patient's satisfaction.     Carver Fila

## 2022-12-29 NOTE — Anesthesia Procedure Notes (Signed)
Procedure Name: Intubation Date/Time: 12/29/2022 11:09 AM  Performed by: Elyn Peers, CRNAPre-anesthesia Checklist: Patient identified, Emergency Drugs available, Suction available, Patient being monitored and Timeout performed Patient Re-evaluated:Patient Re-evaluated prior to induction Oxygen Delivery Method: Circle system utilized Preoxygenation: Pre-oxygenation with 100% oxygen Induction Type: IV induction Ventilation: Mask ventilation without difficulty Laryngoscope Size: Miller and 3 Grade View: Grade I Tube type: Oral Tube size: 7.0 mm Number of attempts: 1 Airway Equipment and Method: Stylet Placement Confirmation: ETT inserted through vocal cords under direct vision, positive ETCO2 and breath sounds checked- equal and bilateral Secured at: 22 cm Tube secured with: Tape Dental Injury: Teeth and Oropharynx as per pre-operative assessment

## 2022-12-29 NOTE — Transfer of Care (Signed)
Immediate Anesthesia Transfer of Care Note  Patient: Allison [name]  Procedure(s) Performed: XI ROBOTIC ASSISTED TOTAL HYSTERECTOMY WITH BILATERAL SALPINGO OOPHORECTOMY SENTINEL NODE BIOPSY LYMPH NODE DISSECTION  Patient Location: PACU  Anesthesia Type:General  Level of Consciousness: awake, drowsy, patient cooperative, and responds to stimulation  Airway & Oxygen Therapy: Patient Spontanous Breathing and Patient connected to face mask oxygen  Post-op Assessment: Report given to RN and Post -op Vital signs reviewed and stable  Post vital signs: Reviewed and stable  Last Vitals:  Vitals Value Taken Time  BP 137/93 12/29/22 1423  Temp    Pulse 83 12/29/22 1425  Resp 18 12/29/22 1425  SpO2 100 % 12/29/22 1425  Vitals shown include unvalidated device data.  Last Pain:  Vitals:   12/29/22 0945  TempSrc: Oral  PainSc: 0-No pain         Complications: No notable events documented.

## 2022-12-29 NOTE — Anesthesia Preprocedure Evaluation (Addendum)
Anesthesia Evaluation  Patient identified by MRN, date of birth, ID band Patient awake    Reviewed: Allergy & Precautions, NPO status , Patient's Chart, lab work & pertinent test results  History of Anesthesia Complications Negative for: history of anesthetic complications  Airway Mallampati: II  TM Distance: >3 FB Neck ROM: Full    Dental no notable dental hx. (+) Dental Advisory Given   Pulmonary asthma , former smoker   Pulmonary exam normal        Cardiovascular hypertension, Pt. on medications Normal cardiovascular exam     Neuro/Psych negative neurological ROS     GI/Hepatic negative GI ROS, Neg liver ROS,,,  Endo/Other    Morbid obesity  Renal/GU negative Renal ROS     Musculoskeletal negative musculoskeletal ROS (+)    Abdominal   Peds  Hematology negative hematology ROS (+)   Anesthesia Other Findings   Reproductive/Obstetrics                             Anesthesia Physical Anesthesia Plan  ASA: 3  Anesthesia Plan: General   Post-op Pain Management: Tylenol PO (pre-op)* and Toradol IV (intra-op)*   Induction: Intravenous  PONV Risk Score and Plan: 4 or greater and Ondansetron, Dexamethasone, Midazolam and Scopolamine patch - Pre-op  Airway Management Planned: Oral ETT  Additional Equipment:   Intra-op Plan:   Post-operative Plan: Extubation in OR  Informed Consent: I have reviewed the patients History and Physical, chart, labs and discussed the procedure including the risks, benefits and alternatives for the proposed anesthesia with the patient or authorized representative who has indicated his/her understanding and acceptance.     Dental advisory given  Plan Discussed with: Anesthesiologist and CRNA  Anesthesia Plan Comments:        Anesthesia Quick Evaluation

## 2022-12-29 NOTE — Discharge Instructions (Addendum)
AFTER SURGERY INSTRUCTIONS   Return to work: 4-6 weeks if applicable  Restart taking aspirin in 3 days after surgery.  Activity: 1. Be up and out of the bed during the day.  Take a nap if needed.  You may walk up steps but be careful and use the hand rail.  Stair climbing will tire you more than you think, you may need to stop part way and rest.    2. No lifting or straining for 6 weeks over 10 pounds. No pushing, pulling, straining for 6 weeks.   3. No driving for around 1 week(s).  Do not drive if you are taking narcotic pain medicine and make sure that your reaction time has returned.    4. You can shower as soon as the next day after surgery. Shower daily.  Use your regular soap and water (not directly on the incision) and pat your incision(s) dry afterwards; don't rub.  No tub baths or submerging your body in water until cleared by your surgeon. If you have the soap that was given to you by pre-surgical testing that was used before surgery, you do not need to use it afterwards because this can irritate your incisions.    5. No sexual activity and nothing in the vagina for 10-12 weeks.   6. You may experience a small amount of clear drainage from your incisions, which is normal.  If the drainage persists, increases, or changes color please call the office.   7. Do not use creams, lotions, or ointments such as neosporin on your incisions after surgery until advised by your surgeon because they can cause removal of the dermabond glue on your incisions.     8. You may experience vaginal spotting after surgery or around the 6-8 week mark from surgery when the stitches at the top of the vagina begin to dissolve.  The spotting is normal but if you experience heavy bleeding, call our office.   9. Take Tylenol or ibuprofen first for pain if you are able to take these medications and only use narcotic pain medication for severe pain not relieved by the Tylenol or Ibuprofen.  Monitor your Tylenol  intake to a max of 4,000 mg in a 24 hour period. You can alternate these medications after surgery.   Diet: 1. Low sodium Heart Healthy Diet is recommended but you are cleared to resume your normal (before surgery) diet after your procedure.   2. It is safe to use a laxative, such as Miralax or Colace, if you have difficulty moving your bowels. You have been prescribed Sennakot-S to take at bedtime every evening after surgery to keep bowel movements regular and to prevent constipation.     Wound Care: 1. Keep clean and dry.  Shower daily.   Reasons to call the Doctor: Fever - Oral temperature greater than 100.4 degrees Fahrenheit Foul-smelling vaginal discharge Difficulty urinating Nausea and vomiting Increased pain at the site of the incision that is unrelieved with pain medicine. Difficulty breathing with or without chest pain New calf pain especially if only on one side Sudden, continuing increased vaginal bleeding with or without clots.   Contacts: For questions or concerns you should contact:   Dr. Eugene Garnet at [phone]   Warner Mccreedy, NP at [phone]   After Hours: call [phone] and have the GYN Oncologist paged/contacted (after 5 pm or on the weekends). You will speak with an after hours RN and let he or she know you have had surgery.  Messages sent via mychart are for non-urgent matters and are not responded to after hours so for urgent needs, please call the after hours number.

## 2022-12-29 NOTE — Op Note (Signed)
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 Obesity made retroperitoneal visualization limited and increased the complexity of the case and necessitated additional instrumentation for retraction. Obesity related complexity increased the duration of the procedure by 45 minutes.   Surgeon: Eugene Garnet MD  Assistant Surgeon: Warner Mccreedy NP (an NP assistant was necessary for tissue manipulation, management of robotic instrumentation, retraction and positioning due to the complexity of the case and hospital policies).   Anesthesia: GET  Urine Output: 150 cc  Operative Findings: On EUA, 10 cm mobile uterus. ON intra-abdominal entry, normal upper abdominal survey with some adhesions between the liver and anterior abdominal wall, possibly related to prior cholecystectomy versus Fitz-Hugh Curtis.  Normal-appearing omentum, small and large bowel.  Filmy adhesions between the posterior aspect of the uterus and bilateral adnexa with the sigmoid colon and mesentery, possibly related to prior pelvic inflammatory disease.  Uterus 10-12 cm and somewhat bulbous but otherwise normal-appearing.  Normal-appearing bilateral adnexa.  Very slow uptake of ICG.  Retroperitoneal spaces opened and ultimately right obturator sentinel lymph node identified.  On the left, unfortunately, channel noted crossing the internal iliac artery that had been transected during initial dissection within the retroperitoneal space to open up the paravesical and pararectal spaces.  Given inability to identify the sentinel lymph node on that side, left pelvic  lymph node dissection was performed. No obvious adenopathy.   Estimated Blood Loss:  100 cc      Total IV Fluids: see I&O flowsheet         Specimens: uterus, cervix, bilateral tubes and ovaries, right obturator SLN, left pelvic lymph nodes         Complications:  None apparent; patient tolerated the procedure well.         Disposition: PACU - hemodynamically stable.  Procedure Details  The patient was seen in the Holding Room. The risks, benefits, complications, treatment options, and expected outcomes were discussed with the patient.  The patient concurred with the proposed plan, giving informed consent.  The site of surgery properly noted/marked. The patient was identified as Allison [name] and the procedure verified as a Robotic-assisted hysterectomy with bilateral salpingo oophorectomy with SLN biopsy.   After induction of anesthesia, the patient was draped and prepped in the usual sterile manner. Patient was placed in supine position after anesthesia and draped and prepped in the usual sterile manner as follows: Her arms were tucked to her side with all appropriate precautions.  The patient was secured to the bed with padding and tape across her chest. The patient was placed in the semi-lithotomy position in [place] stirrups.  The perineum and vagina were prepped with CHG. The patient was draped after the CholoraPrep had been allowed to dry for 3 minutes.  A Time Out was held and the above information confirmed.  The urethra was prepped with Betadine. Foley catheter was placed.  A sterile speculum was placed in the vagina.  The cervix was grasped with a single-tooth tenaculum. 2mg  total of ICG was injected into the cervical stroma at 2 and 9 o'clock with 1cc injected at a 1cm and 2mm depth (concentration 0.5mg /ml) in all locations. The cervix was dilated with Shawnie Pons dilators.  The 3.5 cm Delineator  uterine manipulator with a colpotomizer ring was placed without difficulty.  A pneum occluder  balloon was placed over the manipulator.  OG tube placement was confirmed and to suction.   Next, a 10 mm skin incision was made 1 cm below the subcostal margin in the midclavicular line.  The 5 mm Optiview port and scope was used for direct entry.  Opening pressure was under 10 mm CO2.  The abdomen was insufflated and the findings were noted as above.   At this point and all points during the procedure, the patient's intra-abdominal pressure did not exceed 15 mmHg. Next, an 8 mm skin incision was made superior to the umbilicus and a right and left port were placed about 8 cm lateral to the robot port on the right and left side.  A fourth arm was placed on the right.  The 5 mm assist trocar was exchanged for a 10-12 mm port. All ports were placed under direct visualization.  The patient was placed in steep Trendelenburg.  Bowel was folded away into the upper abdomen.  The robot was docked in the normal manner.  The right and left peritoneum were opened parallel to the IP ligament to open the retroperitoneal spaces bilaterally. The round ligaments were transected. The SLN mapping was performed in bilateral pelvic basins. After identifying the ureters, the para rectal and paravesical spaces were opened up entirely with careful dissection below the level of the ureters bilaterally and to the depth of the uterine artery origin in order to skeletonize the uterine "web" and ensure visualization of all parametrial channels. The para-aortic basins were carefully exposed and evaluated for isolated para-aortic SLN's.  The small bowel mesentery was noted to be quite short.  No mapping was noted within the presacral space or 2 para-aortic lymph nodes.  Ultimately, no channels were initially identified.  The hysterectomy was started.  The ureter was again noted to be on the medial leaf of the broad ligament.  The peritoneum above the ureter was incised and stretched and the infundibulopelvic ligament was skeletonized,  cauterized and cut.  The posterior peritoneum was taken down to the level of the KOH ring.  The anterior peritoneum was also taken down.  The bladder flap was created to the level of the KOH ring.  This was all performed with great care to limit any bleeding.  Retroperitoneal spaces were again examined and the lymphatic channel was noted on the right traveling to a deep obturator sentinel lymph node.  This was separated from surrounding lymphatic tissue and removed and sent for permanent pathology.  On the left, ultimately a channel was noted crossing the internal iliac artery.  The channel had been transected during prior dissection which meant that no sentinel lymph node could be identified.  Given endometrial mass measuring over 3 cm on preoperative ultrasound, decision made to proceed with pelvic lymph node dissection on the left.    The uterine artery on the right side was skeletonized, cauterized and cut in the normal manner.  A similar procedure was performed on the left.  The colpotomy was made and the uterus, cervix, bilateral ovaries and tubes were amputated and delivered through the vagina.  Pedicles were inspected and excellent hemostasis was achieved.    A pelvic lymph dissection was performed on the left with the following borders: proximally the bifurcation of the common iliac, distally the circumflex iliac vein, laterally the genitofemoral nerve, the medial border was the superior vesicle artery and the deep border was  the obturator nerve. All lymphatic tissue was removed and sent to Pathology.   The colpotomy at the vaginal cuff was closed with Vicryl with a figure-of-eight stitch at each apex and 0 V-Loc to close the midportion of the cuff in a running manner.  Irrigation was used and excellent hemostasis was achieved.  At this point in the procedure was completed.  Robotic instruments were removed under direct visulaization.  The robot was undocked. The fascia at the 10-12 mm port was  closed with 0 Vicryl on a UR-5 needle.  The subcuticular tissue was closed with 4-0 Vicryl and the skin was closed with 4-0 Monocryl in a subcuticular manner.  Dermabond was applied.    The vagina was swabbed with  minimal bleeding noted.  Foley catheter was removed. All sponge, lap and needle counts were correct x  3.   The patient was transferred to the recovery room in stable condition.  Eugene Garnet, MD

## 2022-12-29 NOTE — Brief Op Note (Signed)
12/29/2022  2:27 PM  PATIENT:  Allison [name]  57 y.o. female  PRE-OPERATIVE DIAGNOSIS:  ENDOMETRIAL CANCER  POST-OPERATIVE DIAGNOSIS:  ENDOMETRIAL CANCER  PROCEDURE:  Procedure(s): XI ROBOTIC ASSISTED TOTAL HYSTERECTOMY WITH BILATERAL SALPINGO OOPHORECTOMY (N/A) SENTINEL NODE BIOPSY (N/A) LYMPH NODE DISSECTION (N/A)  SURGEON:  Surgeon(s) and Role:    * Carver Fila, MD - Primary  ASSISTANTS: Jed Limerick, Melissa NP   ANESTHESIA:   general  EBL:  100 mL   BLOOD ADMINISTERED: none  DRAINS: none   LOCAL MEDICATIONS USED:  MARCAINE     SPECIMEN:  uterus, cervix, tubes and ovaries, right obturator SLN, left pelvic lymph nodes  DISPOSITION OF SPECIMEN:  PATHOLOGY  COUNTS:  YES  TOURNIQUET:  * No tourniquets in log *  DICTATION: .Note written in EPIC  PLAN OF CARE: Discharge to home after PACU  PATIENT DISPOSITION:  PACU - hemodynamically stable.   Delay start of Pharmacological VTE agent (>24hrs) due to surgical blood loss or risk of bleeding: not applicable

## 2022-12-30 ENCOUNTER — Encounter (HOSPITAL_COMMUNITY): Payer: Self-pay | Admitting: Gynecologic Oncology

## 2022-12-30 ENCOUNTER — Telehealth: Payer: Self-pay | Admitting: *Deleted

## 2022-12-30 NOTE — Telephone Encounter (Signed)
Spoke with Allison [name] this morning. She states she is eating, drinking and urinating well. She has not had a BM yet but is passing gas. She is taking senokot as prescribed and encouraged her to drink plenty of water. She denies fever or chills. Incisions are dry and intact. She rates her pain 6/10. Her pain is controlled with Ibuprofen only.     Instructed to call office with any fever, chills, purulent drainage, uncontrolled pain or any other questions or concerns. Patient verbalizes understanding.   Pt aware of post op appointments as well as the office number [phone] and after hours number [phone] to call if she has any questions or concerns

## 2023-01-04 ENCOUNTER — Ambulatory Visit (HOSPITAL_BASED_OUTPATIENT_CLINIC_OR_DEPARTMENT_OTHER): Payer: Medicaid Other | Admitting: Gynecologic Oncology

## 2023-01-04 ENCOUNTER — Encounter: Payer: Self-pay | Admitting: Gynecologic Oncology

## 2023-01-04 DIAGNOSIS — C541 Malignant neoplasm of endometrium: Secondary | ICD-10-CM

## 2023-01-04 DIAGNOSIS — Z7189 Other specified counseling: Secondary | ICD-10-CM

## 2023-01-04 LAB — SURGICAL PATHOLOGY

## 2023-01-04 NOTE — Progress Notes (Signed)
Gynecologic Oncology Telehealth Note: Gyn-Onc  I connected with Allison [name] on 01/04/23 at  4:00 PM EDT by telephone and verified that I am speaking with the correct person using two identifiers.  I discussed the limitations, risks, security and privacy concerns of performing an evaluation and management service by telemedicine and the availability of in-person appointments. I also discussed with the patient that there may be a patient responsible charge related to this service. The patient expressed understanding and agreed to proceed.  Other persons participating in the visit and their role in the encounter: patient's mom.  Patient's location: home Provider's location: [facility]  Reason for Visit: follow-up after surgery.  Treatment History: In the setting of postmenopausal bleeding, endometrial biopsy was performed on 11/28/2022.  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 P53 WT, loss of MSH6.   Pelvic ultrasound and sonohystogram performed at [facility] on the same day showed a uterus measuring 6.8 x 5.3 x 6.7 cm with an endometrial lining measuring 18 mm.  There is a 3.3 x 1.7 x 2.8 cm vascular and heterogenous endometrial mass with irregular borders.  Bilateral ovaries normal in appearance.  12/29/22: Robotic-assisted laparoscopic total hysterectomy with bilateral salpingoophorectomy, SLN biopsy on the right, pelvic lymph node dissection on the left, lysis of adhesions for approximately 20 minutes   Interval History: Doing well. Still bloated, improving. Was having burning with urination, slowly improving. Denies vaginal bleeding. Bowels improving, still using sennakot.   Past Medical/Surgical History: Past Medical History:  Diagnosis Date   Arthritis    Asthma    rare rescue inhaler use (not since 2021)   Borderline diabetes    Bronchitis    Cancer (HCC)    Family history of breast cancer    Family  history of uterine cancer    Hyperlipidemia    Hypertension    Obesity    Pre-diabetes     Past Surgical History:  Procedure Laterality Date   CHOLECYSTECTOMY     COLONOSCOPY N/A 07/15/2014   Procedure: COLONOSCOPY;  Surgeon: West Bali, MD;  Location: [facility];  Service: Endoscopy;  Laterality: N/A;  9:30 AM - moved to 1:00 - Ginger to notify pt   LYMPH NODE DISSECTION N/A 12/29/2022   Procedure: LYMPH NODE DISSECTION;  Surgeon: Carver Fila, MD;  Location: [facility];  Service: Gynecology;  Laterality: N/A;   REDUCTION MAMMAPLASTY Bilateral    ROBOTIC ASSISTED TOTAL HYSTERECTOMY WITH BILATERAL SALPINGO OOPHERECTOMY N/A 12/29/2022   Procedure: XI ROBOTIC ASSISTED TOTAL HYSTERECTOMY WITH BILATERAL SALPINGO OOPHORECTOMY;  Surgeon: Carver Fila, MD;  Location: [facility];  Service: Gynecology;  Laterality: N/A;   SENTINEL NODE BIOPSY N/A 12/29/2022   Procedure: SENTINEL NODE BIOPSY;  Surgeon: Carver Fila, MD;  Location: [facility];  Service: Gynecology;  Laterality: N/A;    Family History  Problem Relation Age of Onset   Cervical cancer Mother    Fibrocystic breast disease Maternal Grandmother    Breast cancer Maternal Grandmother    Other Maternal Grandfather        MVA   Osteoporosis Daughter    Multiple sclerosis Daughter    Breast cancer Other        MGMs sister   Pancreatic cancer Other        MGMs brother   Diabetes Half-Sister    Heart disease Half-Sister    Lupus Half-Sister    Uterine cancer Half-Sister 39   Heart  attack Half-Brother    Stroke Half-Brother    Diabetes Half-Brother     Social History   Socioeconomic History   Marital status: Single    Spouse name: Not on file   Number of children: 3   Years of education: Not on file   Highest education level: High school graduate  Occupational History   Not on file  Tobacco Use   Smoking status: Former    Types: Cigarettes    Quit date: 2006    Years since quitting: 18.4   Smokeless  tobacco: Never  Vaping Use   Vaping Use: Never used  Substance and Sexual Activity   Alcohol use: No   Drug use: No   Sexual activity: Not Currently    Birth control/protection: Post-menopausal  Other Topics Concern   Not on file  Social History Narrative   Not on file   Social Determinants of Health   Financial Resource Strain: High Risk (09/20/2022)   Overall Financial Resource Strain (CARDIA)    Difficulty of Paying Living Expenses: Hard  Food Insecurity: No Food Insecurity (09/23/2022)   Hunger Vital Sign    Worried About Running Out of Food in the Last Year: Never true    Ran Out of Food in the Last Year: Never true  Recent Concern: Food Insecurity - Food Insecurity Present (09/20/2022)   Hunger Vital Sign    Worried About Running Out of Food in the Last Year: Sometimes true    Ran Out of Food in the Last Year: Sometimes true  Transportation Needs: No Transportation Needs (09/23/2022)   PRAPARE - Administrator, Civil Service (Medical): No    Lack of Transportation (Non-Medical): No  Physical Activity: Insufficiently Active (09/20/2022)   Exercise Vital Sign    Days of Exercise per Week: 2 days    Minutes of Exercise per Session: 10 min  Stress: Stress Concern Present (09/20/2022)   [employer] of Occupational Health - Occupational Stress Questionnaire    Feeling of Stress : To some extent  Social Connections: Socially Isolated (09/20/2022)   Social Connection and Isolation Panel [NHANES]    Frequency of Communication with Friends and Family: More than three times a week    Frequency of Social Gatherings with Friends and Family: Three times a week    Attends Religious Services: Never    Active Member of Clubs or Organizations: No    Attends Banker Meetings: Never    Marital Status: Never married    Current Medications:  Current Outpatient Medications:    albuterol (PROVENTIL HFA;VENTOLIN HFA) 108 (90 BASE) MCG/ACT inhaler, Inhale 2 puffs  into the lungs every 4 (four) hours as needed for wheezing., Disp: , Rfl:    atorvastatin (LIPITOR) 10 MG tablet, Take 10 mg by mouth daily., Disp: , Rfl:    hydrochlorothiazide 25 MG tablet, Take 25 mg by mouth daily.  , Disp: , Rfl:    ibuprofen (ADVIL) 800 MG tablet, Take 1 tablet (800 mg total) by mouth every 8 (eight) hours as needed for moderate pain. For AFTER surgery only, Disp: 30 tablet, Rfl: 0   metformin (FORTAMET) 500 MG (OSM) 24 hr tablet, Take 500 mg by mouth daily with breakfast., Disp: , Rfl:    senna-docusate (SENOKOT-S) 8.6-50 MG tablet, Take 2 tablets by mouth at bedtime. For AFTER surgery, do not take if having diarrhea, Disp: 30 tablet, Rfl: 0   traMADol (ULTRAM) 50 MG tablet, Take 1 tablet (50 mg total) by  mouth every 6 (six) hours as needed for severe pain. For AFTER surgery only, do not take and drive, Disp: 10 tablet, Rfl: 0  Review of Symptoms: Pertinent positives as per HPI.  Physical Exam: Deferred given limitations of phone visit.  Laboratory & Radiologic Studies: A. SENTINEL LYMPH NODE, RIGHT OBTURATOR, EXCISION: - Negative for carcinoma (0/1)  B. UTERUS, CERVIX, BILATERAL FALLOPIAN TUBES AND OVARIES: - Endometrium: Endometrioid carcinoma, FIGO grade 1 - Tumor does not invade myometrium - Myometrium: Leiomyoma - Cervix: Benign, nabothian cyst - Bilateral fallopian tubes and ovaries: No significant pathologic changes - See oncology table  C. LYMPH NODES, LEFT PELVIC, RESECTION: - Three lymph nodes negative for carcinoma (0/3)  ONCOLOGY TABLE:  UTERUS, CARCINOMA OR CARCINOSARCOMA: Resection  Procedure: Total hysterectomy and bilateral salpingo-oophorectomy Histologic Type: Endometrioid carcinoma Histologic Grade: FIGO grade 1 Myometrial Invasion:      Depth of Myometrial Invasion (mm): 0      Myometrial Thickness (mm): 35 mm      Percentage of Myometrial Invasion: 0% Uterine Serosa Involvement: Not identified Cervical stromal Involvement: Not  identified Extent of involvement of other tissue/organs: Not identified Peritoneal/Ascitic Fluid: Not applicable Lymphovascular Invasion: Not identified Regional Lymph Nodes:      Pelvic Lymph Nodes Examined:          1 Sentinel          3 non-sentinel          4 total      Pelvic Lymph Nodes with Metastasis: 0          Macrometastasis: (>2.0 mm): 0          Micrometastasis: (>0.2 mm and < 2.0 mm): 0          Isolated Tumor Cells (<0.2 mm): 0          Laterality of Lymph Node with Tumor: Not applicable          Extracapsular Extension: Not applicable      Distant Metastasis:      Distant Site(s) Involved: Not applicable Pathologic Stage Classification (pTNM, AJCC 8th Edition): pT1a, pN0 Ancillary Studies: Performed on biopsy Representative Tumor Block: B4 Comment(s): Pancytokeratin was performed on the lymph node and is negative.   Assessment & Plan: Allison [name] is a 57 y.o. woman with Stage IA1 grade 1 endometrioid endometrial adenocarcinoma who presents for phone visit. P53 and MMR pending.  Doing well. Discussed burning which I think is related to the catheter. I asked to call the office if symptoms don't continue to improve so that she can come in to give UA and Ucx. Discussed continued expectations and restrictions.  Reviewed pathology from surgery. Patient very happy with this news. No adjuvant treatment indicated given early-stage low risk cancer.   I discussed the assessment and treatment plan with the patient. The patient was provided with an opportunity to ask questions and all were answered. The patient agreed with the plan and demonstrated an understanding of the instructions.   The patient was advised to call back or see an in-person evaluation if the symptoms worsen or if the condition fails to improve as anticipated.   8 minutes of total time was spent for this patient encounter, including preparation, phone counseling with the patient and coordination of care,  and documentation of the encounter.   Eugene Garnet, MD  Division of Gynecologic Oncology  Department of Obstetrics and Gynecology  [facility]

## 2023-01-05 ENCOUNTER — Telehealth: Payer: Medicaid Other | Admitting: Gynecologic Oncology

## 2023-01-09 ENCOUNTER — Encounter: Payer: Self-pay | Admitting: Genetic Counselor

## 2023-01-09 DIAGNOSIS — Z1379 Encounter for other screening for genetic and chromosomal anomalies: Secondary | ICD-10-CM | POA: Insufficient documentation

## 2023-01-12 ENCOUNTER — Telehealth: Payer: Self-pay | Admitting: Genetic Counselor

## 2023-01-12 NOTE — Telephone Encounter (Signed)
LM on VM that results are back and to please call.  Left CB instructions. 

## 2023-01-19 ENCOUNTER — Ambulatory Visit: Payer: Self-pay | Admitting: Genetic Counselor

## 2023-01-19 DIAGNOSIS — Z1379 Encounter for other screening for genetic and chromosomal anomalies: Secondary | ICD-10-CM

## 2023-01-19 DIAGNOSIS — C541 Malignant neoplasm of endometrium: Secondary | ICD-10-CM

## 2023-01-19 NOTE — Progress Notes (Addendum)
HPI:  [name] was previously seen in the [facility] due to a personal and family history of uterine cancer and concerns regarding a hereditary predisposition to cancer. Please refer to our prior cancer genetics clinic note for more information regarding our discussion, assessment and recommendations, at the time. Allison [name] recent genetic test results were disclosed to her, as were recommendations warranted by these results. These results and recommendations are discussed in more detail below.  CANCER HISTORY:  Oncology History  Endometrial cancer (HCC)  12/15/2022 Initial Diagnosis   Endometrial cancer (HCC)   01/06/2023 Genetic Testing   Negative genetic testing on the CancerNext-Expanded+RNAinsight.  The report date is January 06, 2023.  The CancerNext-Expanded gene panel offered by [facility] and includes sequencing and rearrangement analysis for the following 77 genes: AIP, ALK, APC*, ATM*, AXIN2, BAP1, BARD1, BMPR1A, BRCA1*, BRCA2*, BRIP1*, CDC73, CDH1*, CDK4, CDKN1B, CDKN2A, CHEK2*, CTNNA1, DICER1, FH, FLCN, KIF1B, LZTR1, MAX, MEN1, MET, MLH1*, MSH2*, MSH3, MSH6*, MUTYH*, NF1*, NF2, NTHL1, PALB2*, PHOX2B, PMS2*, POT1, PRKAR1A, PTCH1, PTEN*, RAD51C*, RAD51D*, RB1, RET, SDHA, SDHAF2, SDHB, SDHC, SDHD, SMAD4, SMARCA4, SMARCB1, SMARCE1, STK11, SUFU, TMEM127, TP53*, TSC1, TSC2, and VHL (sequencing and deletion/duplication); EGFR, EGLN1, HOXB13, KIT, MITF, PDGFRA, POLD1, and POLE (sequencing only); EPCAM and GREM1 (deletion/duplication only). DNA and RNA analyses performed for * genes.      FAMILY HISTORY:  We obtained a detailed, 4-generation family history.  Significant diagnoses are listed below: Family History  Problem Relation Age of Onset   Cervical cancer Mother    Fibrocystic breast disease Maternal Grandmother    Breast cancer Maternal Grandmother    Other Maternal Grandfather        MVA   Osteoporosis Daughter    Multiple sclerosis Daughter    Breast cancer  Other        MGMs sister   Pancreatic cancer Other        MGMs brother   Diabetes Half-Sister    Heart disease Half-Sister    Lupus Half-Sister    Uterine cancer Half-Sister 36   Heart attack Half-Brother    Stroke Half-Brother    Diabetes Half-Brother      The patient has two daughters and a son who are cancer free.  She has 10 maternal half sisters and three maternal half brothers.  One sister was diagnosed with uterine cancer at 53.  Her mother is living and her biological father is living but she has no information on him.   The patient's mother had cervical cancer at 52.  She has two sisters and a brother who reportedly are cancer free.  The maternal grandmother had breast cancer in her 79's.  She had a brother with pancreatic cancer and a sister with breast cancer.   Allison [name] is unaware of previous family history of genetic testing for hereditary cancer risks. Patient's maternal ancestors are of African American descent. There is no reported Ashkenazi Jewish ancestry. There is no known consanguinity.  GENETIC TEST RESULTS: Genetic testing reported out on January 06, 2023 through the CancerNext-Expanded+RNAinsight cancer panel found no pathogenic mutations. The CancerNext-Expanded gene panel offered by [facility] and includes sequencing and rearrangement analysis for the following 71 genes: AIP, ALK, APC, ATM, BAP1, BARD1, BMPR1A, BRCA1, BRCA2, BRIP1, CDC73, CDH1, CDK4, CDKN1B, CDKN2A, CHEK2, DICER1, FH, FLCN, KIF1B, LZTR1, MAX, MEN1, MET, MLH1, MSH2, MSH6, MUTYH, NF1, NF2, NTHL1, PALB2, PHOX2B, PMS2, POT1, PRKAR1A, PTCH1, PTEN, RAD51C, RAD51D, RB1, RET, SDHA, SDHAF2, SDHB, SDHC, SDHD, SMAD4, SMARCA4, SMARCB1,  SMARCE1, STK11, SUFU, TMEM127, TP53, TSC1, TSC2 and VHL (sequencing and deletion/duplication); AXIN2, CTNNA1, EGFR, EGLN1, HOXB13, KIT, MITF, MSH3, PDGFRA, POLD1 and POLE (sequencing only); EPCAM and GREM1 (deletion/duplication only). RNA data is routinely analyzed for use in variant  interpretation for all genes.  The test report has been scanned into EPIC and is located under the Molecular Pathology section of the Results Review tab.  A portion of the result report is included below for reference.     We discussed with Allison [name] that because current genetic testing is not perfect, it is possible there may be a gene mutation in one of these genes that current testing cannot detect, but that chance is small.  We also discussed, that there could be another gene that has not yet been discovered, or that we have not yet tested, that is responsible for the cancer diagnoses in the family. It is also possible there is a hereditary cause for the cancer in the family that Allison [name] did not inherit and therefore was not identified in her testing.  Therefore, it is important to remain in touch with cancer genetics in the future so that we can continue to offer Allison [name] the most up to date genetic testing.   ADDITIONAL GENETIC TESTING: We discussed with Allison [name] that her genetic testing was fairly extensive.  If there are genes identified to increase cancer risk that can be analyzed in the future, we would be happy to discuss and coordinate this testing at that time.    CANCER SCREENING RECOMMENDATIONS: Allison [name] test result is considered negative (normal).  This means that we have not identified a hereditary cause for her personal and family history of uterine cancer at this time. Most cancers happen by chance and this negative test suggests that her cancer may fall into this category.    Possible reasons for Allison [name] negative genetic test include:  1. There may be a gene mutation in one of these genes that current testing methods cannot detect but that chance is small.  2. There could be another gene that has not yet been discovered, or that we have not yet tested, that is responsible for the cancer diagnoses in the family.  3.  There may be no hereditary risk for cancer in the family. The  cancers in Allison [name] and/or her family may be sporadic/familial or due to other genetic and environmental factors. 4. It is also possible there is a hereditary cause for the cancer in the family that Allison [name] did not inherit.  Therefore, it is recommended she continue to follow the cancer management and screening guidelines provided by her oncology and primary healthcare provider. An individual's cancer risk and medical management are not determined by genetic test results alone. Overall cancer risk assessment incorporates additional factors, including personal medical history, family history, and any available genetic information that may result in a personalized plan for cancer prevention and surveillance  RECOMMENDATIONS FOR FAMILY MEMBERS:  Individuals in this family might be at some increased risk of developing cancer, over the general population risk, simply due to the family history of cancer.  We recommended women in this family have a yearly mammogram beginning at age 51, or 59 years younger than the earliest onset of cancer, an annual clinical breast exam, and perform monthly breast self-exams. Women in this family should also have a gynecological exam as recommended by their primary provider. All family members should be referred for colonoscopy starting at age 19.  FOLLOW-UP: Lastly, we discussed with Allison [name] that cancer genetics is a rapidly advancing field and it is possible that new genetic tests will be appropriate for her and/or her family members in the future. We encouraged her to remain in contact with cancer genetics on an annual basis so we can update her personal and family histories and let her know of advances in cancer genetics that may benefit this family.   Our contact number was provided. Allison [name] questions were answered to her satisfaction, and she knows she is welcome to call us at anytime with additional questions or concerns.   Maylon Cos, MS, [facility] Licensed, Certified  Genetic Counselor [email or web address]

## 2023-01-19 NOTE — Telephone Encounter (Signed)
Revealed negative genetic testing.  Discussed that we do not know why she has uterine cancer with loss of MSHt6 IHC, or why there is cancer in the family. It could be due to a different gene that we are not testing, or maybe our current technology may not be able to pick something up.  It will be important for her to keep in contact with genetics to keep up with whether additional testing may be needed.

## 2023-01-27 ENCOUNTER — Inpatient Hospital Stay: Payer: Medicaid Other

## 2023-01-27 ENCOUNTER — Encounter: Payer: Self-pay | Admitting: Gynecologic Oncology

## 2023-01-27 ENCOUNTER — Inpatient Hospital Stay: Payer: Medicaid Other | Attending: Gynecologic Oncology | Admitting: Gynecologic Oncology

## 2023-01-27 VITALS — BP 145/88 | HR 89 | Temp 96.5°F | Ht 62.21 in | Wt 250.4 lb

## 2023-01-27 DIAGNOSIS — Z90710 Acquired absence of both cervix and uterus: Secondary | ICD-10-CM | POA: Diagnosis not present

## 2023-01-27 DIAGNOSIS — C541 Malignant neoplasm of endometrium: Secondary | ICD-10-CM

## 2023-01-27 DIAGNOSIS — Z90722 Acquired absence of ovaries, bilateral: Secondary | ICD-10-CM | POA: Diagnosis not present

## 2023-01-27 DIAGNOSIS — K5900 Constipation, unspecified: Secondary | ICD-10-CM | POA: Diagnosis not present

## 2023-01-27 DIAGNOSIS — R140 Abdominal distension (gaseous): Secondary | ICD-10-CM | POA: Insufficient documentation

## 2023-01-27 DIAGNOSIS — Z7189 Other specified counseling: Secondary | ICD-10-CM

## 2023-01-27 DIAGNOSIS — N898 Other specified noninflammatory disorders of vagina: Secondary | ICD-10-CM | POA: Diagnosis not present

## 2023-01-27 DIAGNOSIS — R300 Dysuria: Secondary | ICD-10-CM

## 2023-01-27 LAB — URINALYSIS, COMPLETE (UACMP) WITH MICROSCOPIC
Bacteria, UA: NONE SEEN
Bilirubin Urine: NEGATIVE
Glucose, UA: NEGATIVE mg/dL
Ketones, ur: NEGATIVE mg/dL
Leukocytes,Ua: NEGATIVE
Nitrite: NEGATIVE
Protein, ur: NEGATIVE mg/dL
Specific Gravity, Urine: 1.018 (ref 1.005–1.030)
pH: 5 (ref 5.0–8.0)

## 2023-01-27 NOTE — Progress Notes (Signed)
Gynecologic Oncology Return Clinic Visit  01/27/23  Reason for Visit: Follow-up after surgery, treatment planning  Treatment History: Oncology History  Endometrial cancer (HCC)  12/15/2022 Initial Diagnosis   Endometrial cancer (HCC)   12/29/2022 Surgery   Robotic-assisted laparoscopic total hysterectomy with bilateral salpingoophorectomy, SLN biopsy on the right, pelvic lymph node dissection on the left, lysis of adhesions for approximately 20 minutes Extreme morbid obesity requiring additional OR personnel for positioning and retraction. Obesity made retroperitoneal visualization limited and increased the complexity of the case and necessitated additional instrumentation for retraction. Obesity related complexity increased the duration of the procedure by 45 minutes.    12/29/2022 Pathology Results   Stage IA1 grade 1 endometrioid Negative SLN R, pelvic Lns left No LVI Initial biopsy showed p53 WT, loss of MSH6 expression  Germline testing negative   01/06/2023 Genetic Testing   Negative genetic testing on the CancerNext-Expanded+RNAinsight.  The report date is January 06, 2023.  The CancerNext-Expanded gene panel offered by [facility] and includes sequencing and rearrangement analysis for the following 77 genes: AIP, ALK, APC*, ATM*, AXIN2, BAP1, BARD1, BMPR1A, BRCA1*, BRCA2*, BRIP1*, CDC73, CDH1*, CDK4, CDKN1B, CDKN2A, CHEK2*, CTNNA1, DICER1, FH, FLCN, KIF1B, LZTR1, MAX, MEN1, MET, MLH1*, MSH2*, MSH3, MSH6*, MUTYH*, NF1*, NF2, NTHL1, PALB2*, PHOX2B, PMS2*, POT1, PRKAR1A, PTCH1, PTEN*, RAD51C*, RAD51D*, RB1, RET, SDHA, SDHAF2, SDHB, SDHC, SDHD, SMAD4, SMARCA4, SMARCB1, SMARCE1, STK11, SUFU, TMEM127, TP53*, TSC1, TSC2, and VHL (sequencing and deletion/duplication); EGFR, EGLN1, HOXB13, KIT, MITF, PDGFRA, POLD1, and POLE (sequencing only); EPCAM and GREM1 (deletion/duplication only). DNA and RNA analyses performed for * genes.      Interval History: Doing well.  Reports bloating and  intermittent constipation still.  Has been using Senokot.  Last bowel movement on Wednesday was small and hard.  Has some mid abdominal pain related to her bloating.  Endorses some back pain.  Has intermittent dysuria.  Began having spotting on Monday.  Had about a quarter sized bright red spot of blood on her pad on Monday.  Since then, has just been pink when she wipes.  Past Medical/Surgical History: Past Medical History:  Diagnosis Date   Arthritis    Asthma    rare rescue inhaler use (not since 2021)   Borderline diabetes    Bronchitis    Cancer (HCC)    Family history of breast cancer    Family history of uterine cancer    Hyperlipidemia    Hypertension    Obesity    Pre-diabetes     Past Surgical History:  Procedure Laterality Date   CHOLECYSTECTOMY     COLONOSCOPY N/A 07/15/2014   Procedure: COLONOSCOPY;  Surgeon: West Bali, MD;  [facility];  Service: Endoscopy;  Laterality: N/A;  9:30 AM - moved to 1:00 - Ginger to notify pt   LYMPH NODE DISSECTION N/A 12/29/2022   Procedure: LYMPH NODE DISSECTION;  Surgeon: Carver Fila, MD;  Location: [facility];  Service: Gynecology;  Laterality: N/A;   REDUCTION MAMMAPLASTY Bilateral    ROBOTIC ASSISTED TOTAL HYSTERECTOMY WITH BILATERAL SALPINGO OOPHERECTOMY N/A 12/29/2022   Procedure: XI ROBOTIC ASSISTED TOTAL HYSTERECTOMY WITH BILATERAL SALPINGO OOPHORECTOMY;  Surgeon: Carver Fila, MD;  Location: [facility];  Service: Gynecology;  Laterality: N/A;   SENTINEL NODE BIOPSY N/A 12/29/2022   Procedure: SENTINEL NODE BIOPSY;  Surgeon: Carver Fila, MD;  Location: [facility];  Service: Gynecology;  Laterality: N/A;    Family History  Problem Relation Age of Onset   Cervical cancer Mother  Fibrocystic breast disease Maternal Grandmother    Breast cancer Maternal Grandmother    Other Maternal Grandfather        MVA   Osteoporosis Daughter    Multiple sclerosis Daughter    Breast cancer Other        MGMs  sister   Pancreatic cancer Other        MGMs brother   Diabetes Half-Sister    Heart disease Half-Sister    Lupus Half-Sister    Uterine cancer Half-Sister 32   Heart attack Half-Brother    Stroke Half-Brother    Diabetes Half-Brother     Social History   Socioeconomic History   Marital status: Single    Spouse name: Not on file   Number of children: 3   Years of education: Not on file   Highest education level: High school graduate  Occupational History   Not on file  Tobacco Use   Smoking status: Former    Types: Cigarettes    Quit date: 2006    Years since quitting: 18.5   Smokeless tobacco: Never  Vaping Use   Vaping Use: Never used  Substance and Sexual Activity   Alcohol use: No   Drug use: No   Sexual activity: Not Currently    Birth control/protection: Post-menopausal  Other Topics Concern   Not on file  Social History Narrative   Not on file   Social Determinants of Health   Financial Resource Strain: High Risk (09/20/2022)   Overall Financial Resource Strain (CARDIA)    Difficulty of Paying Living Expenses: Hard  Food Insecurity: No Food Insecurity (09/23/2022)   Hunger Vital Sign    Worried About Running Out of Food in the Last Year: Never true    Ran Out of Food in the Last Year: Never true  Recent Concern: Food Insecurity - Food Insecurity Present (09/20/2022)   Hunger Vital Sign    Worried About Running Out of Food in the Last Year: Sometimes true    Ran Out of Food in the Last Year: Sometimes true  Transportation Needs: No Transportation Needs (09/23/2022)   PRAPARE - Administrator, Civil Service (Medical): No    Lack of Transportation (Non-Medical): No  Physical Activity: Insufficiently Active (09/20/2022)   Exercise Vital Sign    Days of Exercise per Week: 2 days    Minutes of Exercise per Session: 10 min  Stress: Stress Concern Present (09/20/2022)   [employer] of Occupational Health - Occupational Stress Questionnaire     Feeling of Stress : To some extent  Social Connections: Socially Isolated (09/20/2022)   Social Connection and Isolation Panel [NHANES]    Frequency of Communication with Friends and Family: More than three times a week    Frequency of Social Gatherings with Friends and Family: Three times a week    Attends Religious Services: Never    Active Member of Clubs or Organizations: No    Attends Banker Meetings: Never    Marital Status: Never married    Current Medications:  Current Outpatient Medications:    albuterol (PROVENTIL HFA;VENTOLIN HFA) 108 (90 BASE) MCG/ACT inhaler, Inhale 2 puffs into the lungs every 4 (four) hours as needed for wheezing., Disp: , Rfl:    atorvastatin (LIPITOR) 10 MG tablet, Take 10 mg by mouth daily., Disp: , Rfl:    hydrochlorothiazide 25 MG tablet, Take 25 mg by mouth daily.  , Disp: , Rfl:    ibuprofen (ADVIL) 800 MG tablet,  Take 1 tablet (800 mg total) by mouth every 8 (eight) hours as needed for moderate pain. For AFTER surgery only, Disp: 30 tablet, Rfl: 0   metformin (FORTAMET) 500 MG (OSM) 24 hr tablet, Take 500 mg by mouth daily with breakfast., Disp: , Rfl:    senna-docusate (SENOKOT-S) 8.6-50 MG tablet, Take 2 tablets by mouth at bedtime. For AFTER surgery, do not take if having diarrhea, Disp: 30 tablet, Rfl: 0   traMADol (ULTRAM) 50 MG tablet, Take 1 tablet (50 mg total) by mouth every 6 (six) hours as needed for severe pain. For AFTER surgery only, do not take and drive, Disp: 10 tablet, Rfl: 0  Review of Systems: + Bloating, dysuria, vaginal bleeding, back pain, itch Denies appetite changes, fevers, chills, fatigue, unexplained weight changes. Denies hearing loss, neck lumps or masses, mouth sores, ringing in ears or voice changes. Denies cough or wheezing.  Denies shortness of breath. Denies chest pain or palpitations. Denies leg swelling. Denies abdominal pain, blood in stools, constipation, diarrhea, nausea, vomiting, or early  satiety. Denies pain with intercourse, frequency, hematuria or incontinence. Denies hot flashes, pelvic pain, vaginal bleeding or vaginal discharge.   Denies joint pain or muscle pain/cramps. Denies rash, or wounds. Denies dizziness, headaches, numbness or seizures. Denies swollen lymph nodes or glands, denies easy bruising or bleeding. Denies anxiety, depression, confusion, or decreased concentration.  Physical Exam: BP (!) 145/88 (BP Location: Right Wrist, Patient Position: Sitting)   Pulse 89   Temp (!) 96.5 F (35.8 C)   Ht 5' 2.21" (1.58 m)   Wt 250 lb 6.4 oz (113.6 kg)   LMP 12/09/2015   SpO2 98%   BMI 45.50 kg/m  General: Alert, oriented, no acute distress. HEENT: Normocephalic, atraumatic, sclera anicteric. Chest: Unlabored breathing on room air. Abdomen: Obese, soft, nontender, mildly distended.  Normoactive bowel sounds.  No masses or hepatosplenomegaly appreciated.  Well-healed incisions. Extremities: Grossly normal range of motion.  Warm, well perfused.  No edema bilaterally. GU: Normal appearing external genitalia without erythema, excoriation, or lesions.  Speculum exam reveals no active bleeding, minimal blood at the vaginal apex along the right.  This area was treated with silver nitrate.  The cuff itself is intact with suture visible.  Bimanual exam reveals intact, no fluctuance or tenderness to palpation.    Laboratory & Radiologic Studies: A. SENTINEL LYMPH NODE, RIGHT OBTURATOR, EXCISION: - Negative for carcinoma (0/1)  B. UTERUS, CERVIX, BILATERAL FALLOPIAN TUBES AND OVARIES: - Endometrium: Endometrioid carcinoma, FIGO grade 1 - Tumor does not invade myometrium - Myometrium: Leiomyoma - Cervix: Benign, nabothian cyst - Bilateral fallopian tubes and ovaries: No significant pathologic changes - See oncology table  C. LYMPH NODES, LEFT PELVIC, RESECTION: - Three lymph nodes negative for carcinoma (0/3)  ONCOLOGY TABLE:  UTERUS, CARCINOMA OR  CARCINOSARCOMA: Resection  Procedure: Total hysterectomy and bilateral salpingo-oophorectomy Histologic Type: Endometrioid carcinoma Histologic Grade: FIGO grade 1 Myometrial Invasion:      Depth of Myometrial Invasion (mm): 0      Myometrial Thickness (mm): 35 mm      Percentage of Myometrial Invasion: 0% Uterine Serosa Involvement: Not identified Cervical stromal Involvement: Not identified Extent of involvement of other tissue/organs: Not identified Peritoneal/Ascitic Fluid: Not applicable Lymphovascular Invasion: Not identified Regional Lymph Nodes:      Pelvic Lymph Nodes Examined:          1 Sentinel          3 non-sentinel  4 total      Pelvic Lymph Nodes with Metastasis: 0          Macrometastasis: (>2.0 mm): 0          Micrometastasis: (>0.2 mm and < 2.0 mm): 0          Isolated Tumor Cells (<0.2 mm): 0          Laterality of Lymph Node with Tumor: Not applicable          Extracapsular Extension: Not applicable      Distant Metastasis:      Distant Site(s) Involved: Not applicable Pathologic Stage Classification (pTNM, AJCC 8th Edition): pT1a, pN0 Ancillary Studies: Performed on biopsy Representative Tumor Block: B4 Comment(s): Pancytokeratin was performed on the lymph node and is negative.   Assessment & Plan: Allison [name] is a 57 y.o. woman with Stage IA1 low-grade endometrioid endometrial adenocarcinoma who presents for postoperative follow-up. P53 WT, loss of MSH6, germline testing negative.  Patient is doing well postoperatively.  Discussed continued expectations and restrictions.  Very small spot that may have been the source of bleeding was seen along the right apex.  This was treated with silver nitrate today.  Given urinary symptoms, urinalysis and urine culture will be sent.  Discussed with her adding MiraLAX to bowel regimen given ongoing constipation.  Discussed pathology from surgery with the patient.  She was given a copy of her pathology  report.  Given early stage, low risk disease, discussed no indication for adjuvant therapy.  Discussed plan for closer surveillance with visits every 6 months.  We will alternate these between my office and her OB/GYN.  I will plan to see her for her first visit in 6 months.  We discussed signs and symptoms that would be concerning for cancer recurrence.  I stressed the importance of calling if she develops these between visits.  20 minutes of total time was spent for this patient encounter, including preparation, face-to-face counseling with the patient and coordination of care, and documentation of the encounter.  Eugene Garnet, MD  Division of Gynecologic Oncology  Department of Obstetrics and Gynecology  [facility]

## 2023-01-27 NOTE — Patient Instructions (Signed)
It was good to see you today.  Please remember, no heavy lifting for at least 6 weeks after surgery and nothing in the vagina for at least 10 weeks.  I will see you for follow-up in 6 months.  As always, if you develop any new and concerning symptoms before your next visit, please call to see me sooner.  These symptoms include but are not limited to vaginal bleeding, pelvic pain, change to bowel function, or unintentional weight loss.

## 2023-01-28 LAB — URINE CULTURE: Culture: NO GROWTH

## 2023-01-30 ENCOUNTER — Telehealth: Payer: Self-pay | Admitting: *Deleted

## 2023-01-30 ENCOUNTER — Other Ambulatory Visit: Payer: Self-pay

## 2023-01-30 ENCOUNTER — Encounter: Payer: Self-pay | Admitting: Oncology

## 2023-01-30 NOTE — Telephone Encounter (Signed)
Call attempt to review urine results. LVM for pt to call back.

## 2023-01-30 NOTE — Progress Notes (Signed)
Requested MSI on accession [id number] with [facility] via email.

## 2023-01-30 NOTE — Telephone Encounter (Signed)
Attempted to reach patient in regards to her urinalysis results and her current urinary and bowel symptoms. A message was unable to be left, voicemail was unavailable at this time.

## 2023-01-30 NOTE — Telephone Encounter (Signed)
-----   Message from Carver Fila, MD sent at 01/30/2023  8:09 AM EDT ----- Reminder to please check in with her about vaginal bleeding and urinary symptoms. Thank you

## 2023-02-10 ENCOUNTER — Encounter: Payer: Self-pay | Admitting: *Deleted

## 2023-07-11 ENCOUNTER — Encounter: Payer: Self-pay | Admitting: Gynecologic Oncology

## 2023-07-21 ENCOUNTER — Ambulatory Visit: Payer: Medicaid Other | Admitting: Gynecologic Oncology

## 2023-07-24 ENCOUNTER — Inpatient Hospital Stay: Payer: Medicaid Other | Attending: Gynecologic Oncology | Admitting: Gynecologic Oncology

## 2023-07-24 ENCOUNTER — Encounter: Payer: Self-pay | Admitting: Gynecologic Oncology

## 2023-07-24 VITALS — BP 134/80 | HR 90 | Temp 97.6°F | Resp 18 | Ht 62.0 in | Wt 236.0 lb

## 2023-07-24 DIAGNOSIS — Z8542 Personal history of malignant neoplasm of other parts of uterus: Secondary | ICD-10-CM | POA: Insufficient documentation

## 2023-07-24 DIAGNOSIS — Z87891 Personal history of nicotine dependence: Secondary | ICD-10-CM | POA: Diagnosis not present

## 2023-07-24 DIAGNOSIS — Z8049 Family history of malignant neoplasm of other genital organs: Secondary | ICD-10-CM | POA: Diagnosis not present

## 2023-07-24 DIAGNOSIS — Z90710 Acquired absence of both cervix and uterus: Secondary | ICD-10-CM | POA: Diagnosis not present

## 2023-07-24 DIAGNOSIS — C541 Malignant neoplasm of endometrium: Secondary | ICD-10-CM

## 2023-07-24 DIAGNOSIS — Z808 Family history of malignant neoplasm of other organs or systems: Secondary | ICD-10-CM | POA: Diagnosis not present

## 2023-07-24 DIAGNOSIS — Z90722 Acquired absence of ovaries, bilateral: Secondary | ICD-10-CM | POA: Insufficient documentation

## 2023-07-24 DIAGNOSIS — Z800 Family history of malignant neoplasm of digestive organs: Secondary | ICD-10-CM | POA: Insufficient documentation

## 2023-07-24 DIAGNOSIS — Z803 Family history of malignant neoplasm of breast: Secondary | ICD-10-CM | POA: Insufficient documentation

## 2023-07-24 NOTE — Progress Notes (Signed)
Gynecologic Oncology Return Clinic Visit  07/24/23  Reason for Visit: surveillance  Treatment History: Oncology History  Endometrial cancer (HCC)  12/15/2022 Initial Diagnosis   Endometrial cancer (HCC)   12/29/2022 Surgery   Robotic-assisted laparoscopic total hysterectomy with bilateral salpingoophorectomy, SLN biopsy on the right, pelvic lymph node dissection on the left, lysis of adhesions for approximately 20 minutes Extreme morbid obesity requiring additional OR personnel for positioning and retraction. Obesity made retroperitoneal visualization limited and increased the complexity of the case and necessitated additional instrumentation for retraction. Obesity related complexity increased the duration of the procedure by 45 minutes.    12/29/2022 Pathology Results   Stage IA1 grade 1 endometrioid Negative SLN R, pelvic Lns left No LVI Initial biopsy showed p53 WT, loss of MSH6 expression  Germline testing negative   01/06/2023 Genetic Testing   Negative genetic testing on the CancerNext-Expanded+RNAinsight.  The report date is January 06, 2023.  The CancerNext-Expanded gene panel offered by [facility] and includes sequencing and rearrangement analysis for the following 77 genes: AIP, ALK, APC*, ATM*, AXIN2, BAP1, BARD1, BMPR1A, BRCA1*, BRCA2*, BRIP1*, CDC73, CDH1*, CDK4, CDKN1B, CDKN2A, CHEK2*, CTNNA1, DICER1, FH, FLCN, KIF1B, LZTR1, MAX, MEN1, MET, MLH1*, MSH2*, MSH3, MSH6*, MUTYH*, NF1*, NF2, NTHL1, PALB2*, PHOX2B, PMS2*, POT1, PRKAR1A, PTCH1, PTEN*, RAD51C*, RAD51D*, RB1, RET, SDHA, SDHAF2, SDHB, SDHC, SDHD, SMAD4, SMARCA4, SMARCB1, SMARCE1, STK11, SUFU, TMEM127, TP53*, TSC1, TSC2, and VHL (sequencing and deletion/duplication); EGFR, EGLN1, HOXB13, KIT, MITF, PDGFRA, POLD1, and POLE (sequencing only); EPCAM and GREM1 (deletion/duplication only). DNA and RNA analyses performed for * genes.      Interval History: Doing well.  He continues to work on weight loss.  Has some nerve pain  around one of her incisions.  Denies any abdominal or pelvic pain.  Denies any vaginal bleeding.  Reports excellent bowel function, denies any urinary symptoms.  Symptoms she was experiencing after surgery completely resolved.  Past Medical/Surgical History: Past Medical History:  Diagnosis Date   Arthritis    Asthma    rare rescue inhaler use (not since 2021)   Borderline diabetes    Bronchitis    Cancer (HCC)    Family history of breast cancer    Family history of uterine cancer    Hyperlipidemia    Hypertension    Obesity    Pre-diabetes     Past Surgical History:  Procedure Laterality Date   CHOLECYSTECTOMY     COLONOSCOPY N/A 07/15/2014   Procedure: COLONOSCOPY;  Surgeon: West Bali, MD;  [facility];  Service: Endoscopy;  Laterality: N/A;  9:30 AM - moved to 1:00 - Ginger to notify pt   LYMPH NODE DISSECTION N/A 12/29/2022   Procedure: LYMPH NODE DISSECTION;  Surgeon: Carver Fila, MD;  Location: [facility];  Service: Gynecology;  Laterality: N/A;   REDUCTION MAMMAPLASTY Bilateral    ROBOTIC ASSISTED TOTAL HYSTERECTOMY WITH BILATERAL SALPINGO OOPHERECTOMY N/A 12/29/2022   Procedure: XI ROBOTIC ASSISTED TOTAL HYSTERECTOMY WITH BILATERAL SALPINGO OOPHORECTOMY;  Surgeon: Carver Fila, MD;  Location: [facility];  Service: Gynecology;  Laterality: N/A;   SENTINEL NODE BIOPSY N/A 12/29/2022   Procedure: SENTINEL NODE BIOPSY;  Surgeon: Carver Fila, MD;  Location: [facility];  Service: Gynecology;  Laterality: N/A;    Family History  Problem Relation Age of Onset   Cervical cancer Mother    Fibrocystic breast disease Maternal Grandmother    Breast cancer Maternal Grandmother    Other Maternal Grandfather        MVA  Osteoporosis Daughter    Multiple sclerosis Daughter    Breast cancer Other        MGMs sister   Pancreatic cancer Other        MGMs brother   Diabetes Half-Sister    Heart disease Half-Sister    Lupus Half-Sister    Uterine cancer  Half-Sister 97   Heart attack Half-Brother    Stroke Half-Brother    Diabetes Half-Brother     Social History   Socioeconomic History   Marital status: Single    Spouse name: Not on file   Number of children: 3   Years of education: Not on file   Highest education level: High school graduate  Occupational History   Not on file  Tobacco Use   Smoking status: Former    Current packs/day: 0.00    Types: Cigarettes    Quit date: 2006    Years since quitting: 18.9   Smokeless tobacco: Never  Vaping Use   Vaping status: Never Used  Substance and Sexual Activity   Alcohol use: No   Drug use: No   Sexual activity: Not Currently    Birth control/protection: Post-menopausal  Other Topics Concern   Not on file  Social History Narrative   Not on file   Social Drivers of Health   Financial Resource Strain: High Risk (09/20/2022)   Overall Financial Resource Strain (CARDIA)    Difficulty of Paying Living Expenses: Hard  Food Insecurity: No Food Insecurity (09/23/2022)   Hunger Vital Sign    Worried About Running Out of Food in the Last Year: Never true    Ran Out of Food in the Last Year: Never true  Recent Concern: Food Insecurity - Food Insecurity Present (09/20/2022)   Hunger Vital Sign    Worried About Running Out of Food in the Last Year: Sometimes true    Ran Out of Food in the Last Year: Sometimes true  Transportation Needs: No Transportation Needs (09/23/2022)   PRAPARE - Administrator, Civil Service (Medical): No    Lack of Transportation (Non-Medical): No  Physical Activity: Insufficiently Active (09/20/2022)   Exercise Vital Sign    Days of Exercise per Week: 2 days    Minutes of Exercise per Session: 10 min  Stress: Stress Concern Present (09/20/2022)   [employer] of Occupational Health - Occupational Stress Questionnaire    Feeling of Stress : To some extent  Social Connections: Socially Isolated (09/20/2022)   Social Connection and Isolation  Panel [NHANES]    Frequency of Communication with Friends and Family: More than three times a week    Frequency of Social Gatherings with Friends and Family: Three times a week    Attends Religious Services: Never    Active Member of Clubs or Organizations: No    Attends Banker Meetings: Never    Marital Status: Never married    Current Medications:  Current Outpatient Medications:    albuterol (PROVENTIL HFA;VENTOLIN HFA) 108 (90 BASE) MCG/ACT inhaler, Inhale 2 puffs into the lungs every 4 (four) hours as needed for wheezing., Disp: , Rfl:    atorvastatin (LIPITOR) 10 MG tablet, Take 10 mg by mouth daily., Disp: , Rfl:    hydrochlorothiazide 25 MG tablet, Take 25 mg by mouth daily.  , Disp: , Rfl:    ibuprofen (ADVIL) 800 MG tablet, Take 1 tablet (800 mg total) by mouth every 8 (eight) hours as needed for moderate pain. For AFTER surgery only, Disp:  30 tablet, Rfl: 0   metformin (FORTAMET) 500 MG (OSM) 24 hr tablet, Take 500 mg by mouth daily with breakfast., Disp: , Rfl:    senna-docusate (SENOKOT-S) 8.6-50 MG tablet, Take 2 tablets by mouth at bedtime. For AFTER surgery, do not take if having diarrhea, Disp: 30 tablet, Rfl: 0  Review of Systems: Denies appetite changes, fevers, chills, fatigue, unexplained weight changes. Denies hearing loss, neck lumps or masses, mouth sores, ringing in ears or voice changes. Denies cough or wheezing.  Denies shortness of breath. Denies chest pain or palpitations. Denies leg swelling. Denies abdominal distention, pain, blood in stools, constipation, diarrhea, nausea, vomiting, or early satiety. Denies pain with intercourse, dysuria, frequency, hematuria or incontinence. Denies hot flashes, pelvic pain, vaginal bleeding or vaginal discharge.   Denies joint pain, back pain or muscle pain/cramps. Denies itching, rash, or wounds. Denies dizziness, headaches, numbness or seizures. Denies swollen lymph nodes or glands, denies easy bruising  or bleeding. Denies anxiety, depression, confusion, or decreased concentration.  Physical Exam: BP 134/80 (BP Location: Left Arm, Patient Position: Sitting)   Pulse 90   Temp 97.6 F (36.4 C) (Oral)   Resp 18   Ht 5\' 2"  (1.575 m)   Wt 236 lb (107 kg)   LMP 12/09/2015   SpO2 100%   BMI 43.16 kg/m  General: Alert, oriented, no acute distress. HEENT: Normocephalic, atraumatic, sclera anicteric. Chest: Clear to auscultation bilaterally.  No wheezes or rhonchi. Cardiovascular: Regular rate and rhythm, no murmurs. Abdomen: Obese, soft, nontender.  Normoactive bowel sounds.  No masses or hepatosplenomegaly appreciated.  Well-healed incisions with keloids of each incision. Extremities: Grossly normal range of motion.  Warm, well perfused.  No edema bilaterally. Skin: No rashes or lesions noted. Lymphatics: No cervical, supraclavicular, or inguinal adenopathy. GU: Normal appearing external genitalia without erythema, excoriation, or lesions.  Speculum exam reveals well rugated vaginal mucosa, no lesions.  Cuff intact, no blood or discharge.  Bimanual exam reveals no masses or nodularity.  Laboratory & Radiologic Studies: None new  Assessment & Plan: Allison [name] is a 57 y.o. woman with Stage IA1 low-grade endometrioid endometrial adenocarcinoma who presents for surveillance. P53 WT, loss of MSH6, germline testing negative.   Doing well.  NED on exam today.   Per NCCN surveillance recommendations, we will plan on visits every 6 months.  We will alternate these between my office and her OB/GYN initially for the first 2-3 years and then transition to visits with her OB/GYN.  I will plan to see her for her first visit in 12 months.  We discussed signs and symptoms that would be concerning for cancer recurrence.  I stressed the importance of calling if she develops these between visits.  20 minutes of total time was spent for this patient encounter, including preparation, face-to-face  counseling with the patient and coordination of care, and documentation of the encounter.  Eugene Garnet, MD  Division of Gynecologic Oncology  Department of Obstetrics and Gynecology  [facility]

## 2023-07-24 NOTE — Patient Instructions (Addendum)
It was good to see you today.  I do not see or feel any evidence of cancer recurrence on your exam.  I will see you for follow-up in 12 months.  Please call your OB/GYN to schedule a visit in 6 months.  As always, if you develop any new and concerning symptoms before your next visit, please call to see me sooner.  This would include symptoms like vaginal bleeding, discharge, pelvic pain, change to bowel or bladder function.  Congratulations on your weight loss!

## 2023-08-16 ENCOUNTER — Telehealth: Payer: Self-pay | Admitting: *Deleted

## 2023-08-16 NOTE — Telephone Encounter (Signed)
 LMOM for the patient to call the office back. Need to move the patient's appt from 12/26 to 12/19

## 2023-08-17 NOTE — Telephone Encounter (Signed)
Ms. Wences returned call stating she can move her appointment to 12/19 with Dr.Tucker

## 2023-11-22 ENCOUNTER — Ambulatory Visit: Admitting: Obstetrics & Gynecology

## 2024-01-03 ENCOUNTER — Encounter: Payer: Self-pay | Admitting: Obstetrics & Gynecology

## 2024-01-03 ENCOUNTER — Ambulatory Visit: Admitting: Obstetrics & Gynecology

## 2024-01-03 VITALS — BP 135/84 | HR 78 | Ht 62.0 in | Wt 233.4 lb

## 2024-01-03 DIAGNOSIS — Z1231 Encounter for screening mammogram for malignant neoplasm of breast: Secondary | ICD-10-CM

## 2024-01-03 DIAGNOSIS — Z01419 Encounter for gynecological examination (general) (routine) without abnormal findings: Secondary | ICD-10-CM

## 2024-01-03 DIAGNOSIS — C541 Malignant neoplasm of endometrium: Secondary | ICD-10-CM

## 2024-01-03 DIAGNOSIS — Z8542 Personal history of malignant neoplasm of other parts of uterus: Secondary | ICD-10-CM | POA: Diagnosis not present

## 2024-01-03 NOTE — Progress Notes (Signed)
 WELL-WOMAN EXAMINATION Patient name: Allison [name] MRN [id number]  Date of birth: 1965/12/25 Chief Complaint:   Gynecologic Exam  History of Present Illness:   Allison [name] is a 58 y.o. 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   Today she notes that she is doing well and reports no acute GYN concerns.  Denies vaginal bleeding, discharge, itching or irritation.  Denies pelvic or abdominal pain.  Last visit with Dr. Tucker-December 2024   Patient's last menstrual period was 12/09/2015.  Last pap 08/2022.  Last mammogram: 09/2022. Last colonoscopy: 2015     01/03/2024   10:14 AM 09/20/2022   10:20 AM  Depression screen PHQ 2/9  Decreased Interest 3 0  Down, Depressed, Hopeless 3 1  PHQ - 2 Score 6 1  Altered sleeping 3 3  Tired, decreased energy 1 3  Change in appetite 1 3  Feeling bad or failure about yourself  2 3  Trouble concentrating 1 3  Moving slowly or fidgety/restless 0 2  Suicidal thoughts 0 0  PHQ-9 Score 14 18      Review of Systems:   Pertinent items are noted in HPI Denies any headaches, blurred vision, fatigue, shortness of breath, chest pain, abdominal pain, bowel movements, urination, or intercourse unless otherwise stated above.  Pertinent History Reviewed:  Reviewed past medical,surgical, social and family history.  Reviewed problem list, medications and allergies. Physical Assessment:   Vitals:   01/03/24 1017  BP: 135/84  Pulse: 78  Weight: 233 lb 6.4 oz (105.9 kg)  Height: 5\' 2"  (1.575 m)  Body mass index is 42.69 kg/m.        Physical Examination:   General appearance - well appearing, and in no distress  Mental status - alert, oriented to person, place, and time  Psych:  She has a normal mood and affect  Skin - warm and dry, normal color, no suspicious lesions noted  Chest - effort normal, all lung fields clear to auscultation bilaterally  Heart - normal rate and regular  rhythm  Neck:  midline trachea, no thyromegaly or nodules  Breasts - breasts appear normal, no suspicious masses, no skin or nipple changes or  axillary nodes  Abdomen - obese, soft, nontender, nondistended, no masses or organomegaly.  Incisions well healed- reassured pt that keloids can happen and are normal  Pelvic - VULVA: normal appearing vulva with no masses, tenderness or lesions  VAGINA: normal appearing vagina with normal color and discharge, no lesions.  Vaginal cuff- no lesions, bleeding or irregularities noted.  On bimanual exam- no abnormalities noted.  No masses or pain.    Extremities:  No swelling or varicosities noted  Chaperone: pt declined     Assessment & Plan:  1) Well-Woman Exam -pap up to date, due to prior hysterectomy, no further Pap smears indicated - Mammogram ordered - Patient encouraged to reach out to GI regarding colonoscopy  2) h/o endometrioid carcinoma - Reviewed Dr. Orvil Bland note, continue every 6 month examinations - No abnormalities noted on today's exam.  Patient to see Dr. Orvil Bland in December and next visit here June 2026  Orders Placed This Encounter  Procedures   MM 3D SCREENING MAMMOGRAM BILATERAL BREAST    Meds: No orders of the defined types were placed in this encounter.   Follow-up: Return in about 1 year (around 01/02/2025) for Annual.   Shacoya Burkhammer, DO Attending Obstetrician & Gynecologist, [facility] for [facility]  Healthcare, [facility]

## 2024-01-10 ENCOUNTER — Ambulatory Visit (HOSPITAL_COMMUNITY)
Admission: RE | Admit: 2024-01-10 | Discharge: 2024-01-10 | Disposition: A | Discharge status: Home / Self Care | Source: Ambulatory Visit | Attending: Obstetrics & Gynecology | Admitting: Obstetrics & Gynecology

## 2024-01-10 DIAGNOSIS — Z1231 Encounter for screening mammogram for malignant neoplasm of breast: Secondary | ICD-10-CM | POA: Diagnosis not present

## 2024-01-12 ENCOUNTER — Ambulatory Visit: Payer: Self-pay | Admitting: Obstetrics & Gynecology

## 2024-01-17 ENCOUNTER — Other Ambulatory Visit (HOSPITAL_COMMUNITY): Payer: Self-pay | Admitting: *Deleted

## 2024-01-17 DIAGNOSIS — M545 Low back pain, unspecified: Secondary | ICD-10-CM

## 2024-01-19 ENCOUNTER — Ambulatory Visit (HOSPITAL_COMMUNITY)
Admission: RE | Admit: 2024-01-19 | Discharge: 2024-01-19 | Disposition: A | Discharge status: Home / Self Care | Source: Ambulatory Visit | Attending: *Deleted | Admitting: *Deleted

## 2024-01-19 DIAGNOSIS — M545 Low back pain, unspecified: Secondary | ICD-10-CM | POA: Diagnosis present

## 2024-01-25 ENCOUNTER — Encounter (INDEPENDENT_AMBULATORY_CARE_PROVIDER_SITE_OTHER): Payer: Self-pay | Admitting: *Deleted

## 2024-02-21 ENCOUNTER — Telehealth: Payer: Self-pay

## 2024-02-21 NOTE — Telephone Encounter (Signed)
 Who is your primary care physician: [facility] Department  Reasons for the colonoscopy: Hx polyps  Have you had a colonoscopy before?  Yes 07-16-2014   Do you have family history of colon cancer? no  Previous colonoscopy with polyps removed? yes  Do you have a history colorectal cancer?   no  Are you diabetic? If yes, Type 1 or Type 2?    no  Do you have a prosthetic or mechanical heart valve? no  Do you have a pacemaker/defibrillator?   no  Have you had endocarditis/atrial fibrillation? no  Have you had joint replacement within the last 12 months?  no  Do you tend to be constipated or have to use laxatives? no  Do you have any history of drugs or alchohol?  no  Do you use supplemental oxygen?  no  Have you had a stroke or heart attack within the last 6 months? no  Do you take weight loss medication?  no  For female patients: have you had a hysterectomy?  yes                                     are you post menopausal?       no                                            do you still have your menstrual cycle? no      Do you take any blood-thinning medications such as: (aspirin, warfarin, Plavix, Aggrenox)  no  If yes we need the name, milligram, dosage and who is prescribing doctor  Current Outpatient Medications on File Prior to Visit  Medication Sig Dispense Refill   atorvastatin (LIPITOR) 10 MG tablet Take 10 mg by mouth daily.     hydrochlorothiazide  25 MG tablet Take 25 mg by mouth daily.       meloxicam (MOBIC) 15 MG tablet 15 mg.     metformin (FORTAMET) 500 MG (OSM) 24 hr tablet Take 500 mg by mouth daily with breakfast.     Vitamin D, Ergocalciferol, (DRISDOL) 1.25 MG (50000 UNIT) CAPS capsule Take 50,000 Units by mouth once a week.     No current facility-administered medications on file prior to visit.    Allergies  Allergen Reactions   Rice Other (See Comments)    starch, rice     Pharmacy: [facility]  [place]  Primary Insurance Name: [facility] [id number]  Best number where you can be reached: [phone]

## 2024-03-12 NOTE — H&P (Signed)
  Patient: Jenavie Stanczak  PID: [id number]  DOB: Sep 21, 1965  SEX: Female   Patient referred by DDS for extraction teeth 1, 2, 16, 17, 32.   CC: Pain upper right  Past Medical History:  Arthritis, High Blood Pressure, Asthma, Snoring, High Cholesterol, dieting, Morbid Obesity    Medications: Albuterol , hydrochlorothiazide , Vitamin D2, atorvastatin    Allergies:     NKDA    Surgeries:   breast reduction, Keloid Removal, Hysterectomy     Social History       Smoking:            Alcohol: Drug use:                             Exam: BMI  43. Erupted # 1, gross decay # 2, 16. Partially Impacted # 17. No purulence, edema, fluctuance, trismus. Oral cancer screening negative. Pharynx clear. No lymphadenopathy.  Panorex: Erupted #1, gross decay # 2, 16, impacted 17, 32.   Assessment: ASA 3. Non-restorable  1, 2, 16, impacted 17, 32. Recommend no treatment # 32 unless symptoms begin due to deeply impacted near IAN and chance of numbness, temporary or permanent.              Plan: Extraction Teeth #1, 2, 16, 17.  Hospital Day surgery.                 Rx: n               Risks and complications explained. Questions answered.   Glendia EMERSON Primrose, DMD

## 2024-03-19 ENCOUNTER — Encounter (HOSPITAL_COMMUNITY): Payer: Self-pay | Admitting: Oral Surgery

## 2024-03-19 ENCOUNTER — Other Ambulatory Visit: Payer: Self-pay

## 2024-03-19 NOTE — Progress Notes (Signed)
 Instructions only!  Anesthesia review: N  Patient verbally denies any shortness of breath, fever, cough and chest pain during phone call   -------------  SDW INSTRUCTIONS given:  Your procedure is scheduled on Wednesday, August 20th.  Report to [facility] Main Entrance A at 0630 A.M., and check in at the Admitting office.  Call this number if you have problems the morning of surgery:  [phone]   Remember:  Do not eat or drink after midnight the night before your surgery    Take these medicines the morning of surgery with A SIP OF WATER   albuterol  (VENTOLIN  HFA)-if needed (Please bring on day of surgery)  As of today, STOP taking any Aspirin (unless otherwise instructed by your surgeon) Aleve , Naproxen , Ibuprofen , Motrin , Advil , Goody's, BC's, all herbal medications, fish oil, and all vitamins.                      Do not wear jewelry, make up, or nail polish            Do not wear lotions, powders, perfumes/colognes, or deodorant.            Do not shave 48 hours prior to surgery.  Men may shave face and neck.            Do not bring valuables to the hospital.            [facility] is not responsible for any belongings or valuables.  Do NOT Smoke (Tobacco/Vaping) 24 hours prior to your procedure If you use a CPAP at night, you may bring all equipment for your overnight stay.   Contacts, glasses, dentures or bridgework may not be worn into surgery.      For patients admitted to the hospital, discharge time will be determined by your treatment team.   Patients discharged the day of surgery will not be allowed to drive home, and someone needs to stay with them for 24 hours.    Special instructions:   [place]- Preparing For Surgery  Before surgery, you can play an important role. Because skin is not sterile, your skin needs to be as free of germs as possible. You can reduce the number of germs on your skin by washing with CHG (chlorahexidine gluconate) Soap before  surgery.  CHG is an antiseptic cleaner which kills germs and bonds with the skin to continue killing germs even after washing.    Oral Hygiene is also important to reduce your risk of infection.  Remember - BRUSH YOUR TEETH THE MORNING OF SURGERY WITH YOUR REGULAR TOOTHPASTE  Please do not use if you have an allergy to CHG or antibacterial soaps. If your skin becomes reddened/irritated stop using the CHG.  Do not shave (including legs and underarms) for at least 48 hours prior to first CHG shower. It is OK to shave your face.  Please follow these instructions carefully.   Shower the NIGHT BEFORE SURGERY and the MORNING OF SURGERY with DIAL Soap.   Pat yourself dry with a CLEAN TOWEL.  Wear CLEAN PAJAMAS to bed the night before surgery  Place CLEAN SHEETS on your bed the night of your first shower and DO NOT SLEEP WITH PETS.   Day of Surgery: Please shower morning of surgery  Wear Clean/Comfortable clothing the morning of surgery Do not apply any deodorants/lotions.   Remember to brush your teeth WITH YOUR REGULAR TOOTHPASTE.   Questions were answered. Patient verbalized understanding of instructions.

## 2024-03-20 ENCOUNTER — Encounter (HOSPITAL_COMMUNITY)
Admission: RE | Disposition: A | Discharge status: Home / Self Care | Payer: Self-pay | Source: Home / Self Care | Attending: Oral Surgery

## 2024-03-20 ENCOUNTER — Other Ambulatory Visit: Payer: Self-pay

## 2024-03-20 ENCOUNTER — Encounter (HOSPITAL_COMMUNITY): Payer: Self-pay | Admitting: Oral Surgery

## 2024-03-20 ENCOUNTER — Ambulatory Visit (HOSPITAL_COMMUNITY): Admitting: Certified Registered Nurse Anesthetist

## 2024-03-20 ENCOUNTER — Ambulatory Visit (HOSPITAL_COMMUNITY)
Admission: RE | Admit: 2024-03-20 | Discharge: 2024-03-20 | Disposition: A | Discharge status: Home / Self Care | Attending: Oral Surgery | Admitting: Oral Surgery

## 2024-03-20 DIAGNOSIS — I10 Essential (primary) hypertension: Secondary | ICD-10-CM | POA: Diagnosis not present

## 2024-03-20 DIAGNOSIS — D649 Anemia, unspecified: Secondary | ICD-10-CM | POA: Diagnosis not present

## 2024-03-20 DIAGNOSIS — Z87891 Personal history of nicotine dependence: Secondary | ICD-10-CM | POA: Insufficient documentation

## 2024-03-20 DIAGNOSIS — E66813 Obesity, class 3: Secondary | ICD-10-CM | POA: Insufficient documentation

## 2024-03-20 DIAGNOSIS — Z6841 Body Mass Index (BMI) 40.0 and over, adult: Secondary | ICD-10-CM | POA: Diagnosis not present

## 2024-03-20 DIAGNOSIS — J45909 Unspecified asthma, uncomplicated: Secondary | ICD-10-CM | POA: Diagnosis not present

## 2024-03-20 DIAGNOSIS — Z79899 Other long term (current) drug therapy: Secondary | ICD-10-CM | POA: Insufficient documentation

## 2024-03-20 DIAGNOSIS — K029 Dental caries, unspecified: Secondary | ICD-10-CM

## 2024-03-20 DIAGNOSIS — K011 Impacted teeth: Secondary | ICD-10-CM | POA: Insufficient documentation

## 2024-03-20 HISTORY — PX: TOOTH EXTRACTION: SHX859

## 2024-03-20 LAB — BASIC METABOLIC PANEL WITH GFR
Anion gap: 10 (ref 5–15)
BUN: 12 mg/dL (ref 6–20)
CO2: 24 mmol/L (ref 22–32)
Calcium: 8.9 mg/dL (ref 8.9–10.3)
Chloride: 104 mmol/L (ref 98–111)
Creatinine, Ser: 0.74 mg/dL (ref 0.44–1.00)
GFR, Estimated: >60 mL/min (ref >60)
Glucose, Bld: 111 mg/dL — ABNORMAL HIGH (ref 70–99)
Potassium: 3.7 mmol/L (ref 3.5–5.1)
Sodium: 138 mmol/L (ref 135–145)

## 2024-03-20 LAB — CBC
HCT: 35.1 % — ABNORMAL LOW (ref 36.0–46.0)
Hemoglobin: 11.3 g/dL — ABNORMAL LOW (ref 12.0–15.0)
MCH: 29.3 pg (ref 26.0–34.0)
MCHC: 32.2 g/dL (ref 30.0–36.0)
MCV: 90.9 fL (ref 80.0–100.0)
Platelets: 387 K/uL (ref 150–400)
RBC: 3.86 MIL/uL — ABNORMAL LOW (ref 3.87–5.11)
RDW: 14.6 % (ref 11.5–15.5)
WBC: 5.2 K/uL (ref 4.0–10.5)
nRBC: 0 % (ref 0.0–0.2)

## 2024-03-20 SURGERY — DENTAL RESTORATION/EXTRACTIONS
Anesthesia: General

## 2024-03-20 MED ORDER — LACTATED RINGERS IV SOLN: INTRAVENOUS | Status: DC

## 2024-03-20 MED ORDER — MIDAZOLAM HCL 2 MG/2ML IJ SOLN
INTRAMUSCULAR | Status: DC | PRN
  Administered 2024-03-20: 2 mg via INTRAVENOUS

## 2024-03-20 MED ORDER — OXYMETAZOLINE HCL 0.05 % NA SOLN
NASAL | Status: AC
  Filled 2024-03-20: qty 30

## 2024-03-20 MED ORDER — AMISULPRIDE (ANTIEMETIC) 5 MG/2ML IV SOLN: 10.0000 mg | Freq: Once | INTRAVENOUS | Status: DC | PRN

## 2024-03-20 MED ORDER — DEXAMETHASONE SODIUM PHOSPHATE 10 MG/ML IJ SOLN
INTRAMUSCULAR | Status: AC
  Filled 2024-03-20: qty 1

## 2024-03-20 MED ORDER — ORAL CARE MOUTH RINSE: 15.0000 mL | Freq: Once | OROMUCOSAL | Status: AC

## 2024-03-20 MED ORDER — ONDANSETRON HCL 4 MG/2ML IJ SOLN
INTRAMUSCULAR | Status: DC | PRN
  Administered 2024-03-20: 4 mg via INTRAVENOUS

## 2024-03-20 MED ORDER — ROCURONIUM BROMIDE 10 MG/ML (PF) SYRINGE
PREFILLED_SYRINGE | INTRAVENOUS | Status: DC | PRN
  Administered 2024-03-20: 50 mg via INTRAVENOUS

## 2024-03-20 MED ORDER — ACETAMINOPHEN 500 MG PO TABS
ORAL_TABLET | ORAL | Status: AC
  Administered 2024-03-20: 1000 mg via ORAL
  Filled 2024-03-20: qty 2

## 2024-03-20 MED ORDER — ROCURONIUM BROMIDE 10 MG/ML (PF) SYRINGE
PREFILLED_SYRINGE | INTRAVENOUS | Status: AC
  Filled 2024-03-20: qty 10

## 2024-03-20 MED ORDER — DEXAMETHASONE SODIUM PHOSPHATE 10 MG/ML IJ SOLN
INTRAMUSCULAR | Status: DC | PRN
  Administered 2024-03-20: 10 mg via INTRAVENOUS

## 2024-03-20 MED ORDER — PROPOFOL 10 MG/ML IV BOLUS
INTRAVENOUS | Status: AC
  Filled 2024-03-20: qty 20

## 2024-03-20 MED ORDER — CHLORHEXIDINE GLUCONATE 0.12 % MT SOLN
15.0000 mL | Freq: Two times a day (BID) | OROMUCOSAL | 0 refills | Status: AC
Start: 2024-03-20 — End: ?

## 2024-03-20 MED ORDER — LIDOCAINE-EPINEPHRINE 2 %-1:100000 IJ SOLN
INTRAMUSCULAR | Status: AC
  Filled 2024-03-20: qty 1

## 2024-03-20 MED ORDER — LIDOCAINE 2% (20 MG/ML) 5 ML SYRINGE
INTRAMUSCULAR | Status: DC | PRN
  Administered 2024-03-20: 60 mg via INTRAVENOUS

## 2024-03-20 MED ORDER — CHLORHEXIDINE GLUCONATE 0.12 % MT SOLN: 15.0000 mL | Freq: Once | OROMUCOSAL | Status: AC

## 2024-03-20 MED ORDER — PROPOFOL 10 MG/ML IV BOLUS
INTRAVENOUS | Status: DC | PRN
  Administered 2024-03-20: 50 mg via INTRAVENOUS
  Administered 2024-03-20: 150 mg via INTRAVENOUS

## 2024-03-20 MED ORDER — LIDOCAINE-EPINEPHRINE 2 %-1:100000 IJ SOLN
INTRAMUSCULAR | Status: DC | PRN
  Administered 2024-03-20: 20 mL via INTRADERMAL

## 2024-03-20 MED ORDER — 0.9 % SODIUM CHLORIDE (POUR BTL) OPTIME
TOPICAL | Status: DC | PRN
  Administered 2024-03-20: 1000 mL

## 2024-03-20 MED ORDER — OXYMETAZOLINE HCL 0.05 % NA SOLN
NASAL | Status: DC | PRN
Start: 2024-03-20 — End: 2024-03-20
  Administered 2024-03-20: 1 via NASAL
  Administered 2024-03-20: 2 via NASAL

## 2024-03-20 MED ORDER — SUGAMMADEX SODIUM 200 MG/2ML IV SOLN
INTRAVENOUS | Status: DC | PRN
  Administered 2024-03-20: 400 mg via INTRAVENOUS

## 2024-03-20 MED ORDER — CHLORHEXIDINE GLUCONATE 0.12 % MT SOLN
OROMUCOSAL | Status: AC
  Administered 2024-03-20: 15 mL via OROMUCOSAL
  Filled 2024-03-20: qty 15

## 2024-03-20 MED ORDER — FENTANYL CITRATE (PF) 250 MCG/5ML IJ SOLN
INTRAMUSCULAR | Status: AC
  Filled 2024-03-20: qty 5

## 2024-03-20 MED ORDER — MIDAZOLAM HCL 2 MG/2ML IJ SOLN
INTRAMUSCULAR | Status: AC
  Filled 2024-03-20: qty 2

## 2024-03-20 MED ORDER — OXYMETAZOLINE HCL 0.05 % NA SOLN
NASAL | Status: AC
Start: 2024-03-20 — End: 2024-03-20
  Filled 2024-03-20: qty 30

## 2024-03-20 MED ORDER — HYDROCODONE-ACETAMINOPHEN 5-325 MG PO TABS: 1.0000 | ORAL_TABLET | ORAL | 0 refills | Status: AC | PRN

## 2024-03-20 MED ORDER — LIDOCAINE 2% (20 MG/ML) 5 ML SYRINGE
INTRAMUSCULAR | Status: AC
Start: 2024-03-20 — End: 2024-03-20
  Filled 2024-03-20: qty 5

## 2024-03-20 MED ORDER — FENTANYL CITRATE (PF) 100 MCG/2ML IJ SOLN: 25.0000 ug | INTRAMUSCULAR | Status: DC | PRN

## 2024-03-20 MED ORDER — ONDANSETRON HCL 4 MG/2ML IJ SOLN
INTRAMUSCULAR | Status: AC
  Filled 2024-03-20: qty 2

## 2024-03-20 MED ORDER — ACETAMINOPHEN 500 MG PO TABS: 1000.0000 mg | ORAL_TABLET | Freq: Once | ORAL | Status: AC

## 2024-03-20 MED ORDER — FENTANYL CITRATE (PF) 250 MCG/5ML IJ SOLN
INTRAMUSCULAR | Status: DC | PRN
Start: 2024-03-20 — End: 2024-03-20
  Administered 2024-03-20: 100 ug via INTRAVENOUS

## 2024-03-20 MED ORDER — CEFAZOLIN SODIUM-DEXTROSE 2-4 GM/100ML-% IV SOLN
2.0000 g | INTRAVENOUS | Status: AC
  Administered 2024-03-20: 2 g via INTRAVENOUS
  Filled 2024-03-20: qty 100

## 2024-03-20 SURGICAL SUPPLY — 26 items
BAG COUNTER SPONGE SURGICOUNT (BAG) IMPLANT
BLADE SURG 15 STRL LF DISP TIS (BLADE) ×1 IMPLANT
BUR CROSS CUT FISSURE 1.6 (BURR) ×1 IMPLANT
BUR EGG ELITE 4.0 (BURR) IMPLANT
CANISTER SUCTION 3000ML PPV (SUCTIONS) ×1 IMPLANT
COVER SURGICAL LIGHT HANDLE (MISCELLANEOUS) ×1 IMPLANT
GAUZE PACKING FOLDED 2 STR (GAUZE/BANDAGES/DRESSINGS) ×1 IMPLANT
GLOVE BIO SURGEON STRL SZ8 (GLOVE) ×1 IMPLANT
GOWN STRL REUS W/ TWL LRG LVL3 (GOWN DISPOSABLE) ×1 IMPLANT
GOWN STRL REUS W/ TWL XL LVL3 (GOWN DISPOSABLE) ×1 IMPLANT
IV NS 1000ML BAXH (IV SOLUTION) ×1 IMPLANT
KIT BASIN OR (CUSTOM PROCEDURE TRAY) ×1 IMPLANT
KIT TURNOVER KIT B (KITS) ×1 IMPLANT
NDL HYPO 25GX1X1/2 BEV (NEEDLE) ×2 IMPLANT
NEEDLE HYPO 25GX1X1/2 BEV (NEEDLE) ×2 IMPLANT
NS IRRIG 1000ML POUR BTL (IV SOLUTION) ×1 IMPLANT
PAD ARMBOARD POSITIONER FOAM (MISCELLANEOUS) ×1 IMPLANT
SLEEVE IRRIGATION ELITE 7 (MISCELLANEOUS) ×1 IMPLANT
SPIKE FLUID TRANSFER (MISCELLANEOUS) IMPLANT
SUT CHROMIC 3 0 SH 27 (SUTURE) IMPLANT
SUT PLAIN 3 0 PS2 27 (SUTURE) IMPLANT
SYR BULB IRRIG 60ML STRL (SYRINGE) ×1 IMPLANT
SYR CONTROL 10ML LL (SYRINGE) ×1 IMPLANT
TRAY ENT MC OR (CUSTOM PROCEDURE TRAY) ×1 IMPLANT
TUBING IRRIGATION (MISCELLANEOUS) ×1 IMPLANT
YANKAUER SUCT BULB TIP NO VENT (SUCTIONS) ×1 IMPLANT

## 2024-03-20 NOTE — Anesthesia Preprocedure Evaluation (Signed)
 Anesthesia Evaluation  Patient identified by MRN, date of birth, ID band Patient awake    Reviewed: Allergy & Precautions, NPO status , Patient's Chart, lab work & pertinent test results  Airway Mallampati: II  TM Distance: >3 FB Neck ROM: Full    Dental  (+) Dental Advisory Given   Pulmonary asthma , former smoker   breath sounds clear to auscultation       Cardiovascular hypertension, Pt. on medications  Rhythm:Regular Rate:Normal     Neuro/Psych negative neurological ROS     GI/Hepatic negative GI ROS, Neg liver ROS,,,  Endo/Other    Class 3 obesity  Renal/GU negative Renal ROS     Musculoskeletal  (+) Arthritis ,    Abdominal   Peds  Hematology  (+) Blood dyscrasia, anemia   Anesthesia Other Findings   Reproductive/Obstetrics                              Anesthesia Physical Anesthesia Plan  ASA: 3  Anesthesia Plan: General   Post-op Pain Management: Tylenol  PO (pre-op)*   Induction: Intravenous  PONV Risk Score and Plan: 3 and Dexamethasone , Ondansetron , Midazolam  and Treatment may vary due to age or medical condition  Airway Management Planned: Nasal ETT and Video Laryngoscope Planned  Additional Equipment: None  Intra-op Plan:   Post-operative Plan: Extubation in OR  Informed Consent: I have reviewed the patients History and Physical, chart, labs and discussed the procedure including the risks, benefits and alternatives for the proposed anesthesia with the patient or authorized representative who has indicated his/her understanding and acceptance.     Dental advisory given  Plan Discussed with: CRNA and Surgeon  Anesthesia Plan Comments:         Anesthesia Quick Evaluation

## 2024-03-20 NOTE — H&P (Signed)
 H&P documentation  -History and Physical Reviewed  -Patient has been re-examined  -No change in the plan of care  Allison [name]

## 2024-03-20 NOTE — Transfer of Care (Signed)
 Immediate Anesthesia Transfer of Care Note  Patient: Allison [name]  Procedure(s) Performed: DENTAL RESTORATION/EXTRACTIONS  Patient Location: PACU  Anesthesia Type:General  Level of Consciousness: drowsy, patient cooperative, and responds to stimulation  Airway & Oxygen Therapy: Patient Spontanous Breathing and Patient connected to face mask oxygen  Post-op Assessment: Report given to RN, Post -op Vital signs reviewed and stable, and Patient moving all extremities X 4  Post vital signs: Reviewed and stable  Last Vitals:  Vitals Value Taken Time  BP 129/67 03/20/24 09:34  Temp 36.4 C 03/20/24 09:34  Pulse 96 03/20/24 09:39  Resp 16 03/20/24 09:39  SpO2 100 % 03/20/24 09:39  Vitals shown include unfiled device data.  Last Pain:  Vitals:   03/20/24 0934  TempSrc:   PainSc: Asleep         Complications: No notable events documented.

## 2024-03-20 NOTE — Op Note (Signed)
 NAME: MARKI, FREDE MEDICAL RECORD NO: [id number] ACCOUNT NO: [id number] DATE OF BIRTH: 12/03/1965 FACILITY: [facility] LOCATION: [facility] PHYSICIAN: Glendia EMERSON Primrose, DDS  Operative Report   DATE OF PROCEDURE: 03/20/2024  PREOPERATIVE DIAGNOSES: 1.  Nonrestorable teeth #1, #2, and #16, secondary to dental caries. 2.  Impacted tooth #17. 3.  Morbid obesity.  POSTOPERATIVE DIAGNOSES: 1.  Nonrestorable teeth #1, #2, and #16, secondary to dental caries. 2.  Impacted tooth #17. 3.  Morbid obesity.  PROCEDURE:  Extraction of teeth #1, #2, #16, and #17.  SURGEON:  Glendia EMERSON Primrose, DDS  ANESTHESIA:  General, nasal intubation, Dr. Epifanio attending.  DESCRIPTION OF PROCEDURE: The patient was taken to the operating room and placed on the table in supine position.  General anesthesia was administered and a nasal endotracheal tube was placed and secured.  The eyes were protected and the patient was draped for surgery.  A timeout  was performed.  The posterior pharynx was suctioned and a throat pack was placed.  2% lidocaine  with 1:100,000 epinephrine  was infiltrated in an inferior alveolar block on the left side and in buccal and palatal infiltration around tooth #16.  A bite  block was placed in the right side of the mouth.  The #15 blade was used to make an incision overlying tooth #17 and carried forward to the papilla between teeth #18 and #19.  The periosteum was reflected until the bone was exposed.  The Stryker  handpiece was used to remove bone on the buccal aspect of the tooth.  The tooth was elevated and removed from the mouth.  The distal root had fractured and remained and the Stryker handpiece was used to remove bone around the root itself and then the  root was elevated with the 301 elevator and removed.  Then, tooth #16 was removed using the #15 blade to make a sulcular incision.  The periosteum was reflected.  The tooth was elevated with a 301 elevator and removed with the upper  dental forceps.   Then, both the sockets were curetted, irrigated, and closed with 3-0 chromic.  Then, attention was turned to the right mandible.  The local anesthesia of 2% lidocaine  with 1:100,000 epinephrine  was infiltrated around teeth #1 and #2 in the maxilla both  buccally and palatally.  Then, the #15 blade was used to make an incision in the sulcus around these teeth.  The periosteum was reflected.  The teeth were elevated and removed with dental forceps and rongeurs.  The sockets were debrided and irrigated and  then closed with 3-0 chromic.  The oral cavity was irrigated and suctioned.  The throat pack was removed.  The patient was left in care of anesthesia for extubation and transported to recovery with plans for discharge home through day surgery.  ESTIMATED BLOOD LOSS:  Minimal.  COMPLICATIONS:  None.  SPECIMENS:  None.  COUNTS:  Correct.    MUK D: 03/20/2024 9:20:15 am T: 03/20/2024 9:34:00 am  JOB: [id number]/ [id number]

## 2024-03-20 NOTE — Anesthesia Procedure Notes (Signed)
 Procedure Name: Intubation Date/Time: 03/20/2024 8:46 AM  Performed by: Jolynn Mage, CRNAPre-anesthesia Checklist: Patient identified, Emergency Drugs available, Suction available and Patient being monitored Patient Re-evaluated:Patient Re-evaluated prior to induction Oxygen Delivery Method: Circle system utilized Preoxygenation: Pre-oxygenation with 100% oxygen Induction Type: IV induction Ventilation: Two handed mask ventilation required and Oral airway inserted - appropriate to patient size Laryngoscope Size: Mac, 4 and Glidescope Grade View: Grade II Nasal Tubes: Nasal prep performed, Nasal Rae and Left Tube size: 7.0 mm Number of attempts: 1 Airway Equipment and Method: Video-laryngoscopy Placement Confirmation: ETT inserted through vocal cords under direct vision, positive ETCO2 and breath sounds checked- equal and bilateral Tube secured with: Tape Dental Injury: Teeth and Oropharynx as per pre-operative assessment  Difficulty Due To: Difficult Airway- due to limited oral opening, Difficult Airway- due to large tongue and Difficult Airway- due to reduced neck mobility Future Recommendations: Recommend- induction with short-acting agent, and alternative techniques readily available

## 2024-03-20 NOTE — Op Note (Signed)
 03/20/2024  9:15 AM  PATIENT:  Allison [name]  58 y.o. female  PRE-OPERATIVE DIAGNOSIS:  NON-RESTORABLE TEETH # 1, 2, 16 SECONDARY TO DENTAL CARIES. IMPACTED TOOTH # 17  POST-OPERATIVE DIAGNOSIS:  SAME  PROCEDURE:  Procedure(s): EXTRACTION TEETH # 1, 2, 16, 17  SURGEON:  Surgeon(s): Sheryle Hamilton, DMD  ANESTHESIA:   local and general  EBL:  minimal  DRAINS: none   SPECIMEN:  No Specimen  COUNTS:  YES  PLAN OF CARE: Discharge to home after PACU  PATIENT DISPOSITION:  PACU - hemodynamically stable.   PROCEDURE DETAILS: Dictation # [id number]  Hamilton EMERSON Sheryle, DMD 03/20/2024 9:15 AM

## 2024-03-20 NOTE — Anesthesia Postprocedure Evaluation (Signed)
 Anesthesia Post Note  Patient: IRIDIAN READER  Procedure(s) Performed: DENTAL RESTORATION/EXTRACTIONS     Patient location during evaluation: PACU Anesthesia Type: General Level of consciousness: awake and alert Pain management: pain level controlled Vital Signs Assessment: post-procedure vital signs reviewed and stable Respiratory status: spontaneous breathing, nonlabored ventilation, respiratory function stable and patient connected to nasal cannula oxygen Cardiovascular status: blood pressure returned to baseline and stable Postop Assessment: no apparent nausea or vomiting Anesthetic complications: no   No notable events documented.  Last Vitals:  Vitals:   03/20/24 1015 03/20/24 1027  BP: (!) 143/82   Pulse: 97   Resp: 20   Temp:  36.9 C  SpO2: 91%     Last Pain:  Vitals:   03/20/24 1013  TempSrc:   PainSc: 0-No pain                 Epifanio Lamar BRAVO

## 2024-03-21 ENCOUNTER — Encounter (HOSPITAL_COMMUNITY): Payer: Self-pay | Admitting: Oral Surgery

## 2024-03-27 NOTE — Telephone Encounter (Signed)
 Okay to schedule.  ASA 3 due to BMI.  Medication adjustments: 1 day prior: Take metformin as prescribed Day of: No morning diabetes medications

## 2024-03-28 ENCOUNTER — Encounter: Payer: Self-pay | Admitting: *Deleted

## 2024-03-28 MED ORDER — PEG 3350-KCL-NA BICARB-NACL 420 G PO SOLR: 4000.0000 mL | Freq: Once | ORAL | 0 refills | Status: AC

## 2024-03-28 NOTE — Telephone Encounter (Signed)
 Pt called back and states that she can not do the date she was scheduled and she needed to reschedule. Pt has been rescheduled to Friday 04/12/24 updated instructions sent via mychart.

## 2024-03-28 NOTE — Telephone Encounter (Signed)
 PA done via [facility] The prior authorization/notification reference number is: [id number].

## 2024-03-28 NOTE — Telephone Encounter (Signed)
 Spoke with pt scheduled for 04/05/2024. Pt requested instructions and procedure info sent to her mychart. Rx for prep will be sent to her pharmacy.

## 2024-03-28 NOTE — Addendum Note (Signed)
 Addended by: DALLIE LIONEL RAMAN on: 03/28/2024 11:46 AM   Modules accepted: Orders

## 2024-04-01 ENCOUNTER — Encounter (HOSPITAL_COMMUNITY): Payer: Self-pay | Admitting: Emergency Medicine

## 2024-04-01 ENCOUNTER — Emergency Department (HOSPITAL_COMMUNITY)
Admission: EM | Admit: 2024-04-01 | Discharge: 2024-04-01 | Disposition: A | Discharge status: Home / Self Care | Attending: Emergency Medicine | Admitting: Emergency Medicine

## 2024-04-01 ENCOUNTER — Other Ambulatory Visit: Payer: Self-pay

## 2024-04-01 DIAGNOSIS — Z79899 Other long term (current) drug therapy: Secondary | ICD-10-CM | POA: Diagnosis not present

## 2024-04-01 DIAGNOSIS — N3000 Acute cystitis without hematuria: Secondary | ICD-10-CM | POA: Insufficient documentation

## 2024-04-01 DIAGNOSIS — I10 Essential (primary) hypertension: Secondary | ICD-10-CM | POA: Insufficient documentation

## 2024-04-01 DIAGNOSIS — Z7951 Long term (current) use of inhaled steroids: Secondary | ICD-10-CM | POA: Insufficient documentation

## 2024-04-01 DIAGNOSIS — R300 Dysuria: Secondary | ICD-10-CM | POA: Diagnosis present

## 2024-04-01 DIAGNOSIS — Z8541 Personal history of malignant neoplasm of cervix uteri: Secondary | ICD-10-CM | POA: Insufficient documentation

## 2024-04-01 DIAGNOSIS — J45909 Unspecified asthma, uncomplicated: Secondary | ICD-10-CM | POA: Diagnosis not present

## 2024-04-01 LAB — URINALYSIS, ROUTINE W REFLEX MICROSCOPIC
Bilirubin Urine: NEGATIVE
Glucose, UA: NEGATIVE mg/dL
Ketones, ur: NEGATIVE mg/dL
Nitrite: NEGATIVE
Protein, ur: 30 mg/dL — AB
RBC / HPF: >50 RBC/hpf (ref 0–5)
Specific Gravity, Urine: 1.017 (ref 1.005–1.030)
WBC, UA: >50 WBC/hpf (ref 0–5)
pH: 5 (ref 5.0–8.0)

## 2024-04-01 MED ORDER — CEPHALEXIN 500 MG PO CAPS
500.0000 mg | ORAL_CAPSULE | Freq: Once | ORAL | Status: AC
  Administered 2024-04-01: 500 mg via ORAL
  Filled 2024-04-01: qty 1

## 2024-04-01 MED ORDER — CEPHALEXIN 500 MG PO CAPS: 500.0000 mg | ORAL_CAPSULE | Freq: Three times a day (TID) | ORAL | 0 refills | Status: DC

## 2024-04-01 NOTE — ED Provider Notes (Signed)
 [place] EMERGENCY DEPARTMENT AT [facility] Provider Note   CSN: [id number] Arrival date & time: 04/01/24  9656     Patient presents with: No chief complaint on file.   Allison [name] is a 58 y.o. female.   The history is provided by the patient.  Patient with history of asthma, previous history of uterine cancer s/p hysterectomy presents with concern for UTI.  Patient reports that over the past 24 hours she has had urinary frequency, pressure and pain after urinating.  No fevers or vomiting.  She reports unchanged chronic back pain.  No other abdominal pain. No other acute complaints    Past Medical History:  Diagnosis Date   Arthritis    Asthma    rare rescue inhaler use (not since 2021)   Borderline diabetes    Bronchitis    Cancer (HCC)    Family history of breast cancer    Family history of uterine cancer    Hyperlipidemia    Hypertension    Obesity    Pre-diabetes    Uterine cancer (HCC)     Prior to Admission medications   Medication Sig Start Date End Date Taking? Authorizing Provider  cephALEXin  (KEFLEX ) 500 MG capsule Take 1 capsule (500 mg total) by mouth 3 (three) times daily. 04/01/24  Yes Midge Golas, MD  albuterol  (VENTOLIN  HFA) 108 (90 Base) MCG/ACT inhaler Inhale 2 puffs into the lungs every 6 (six) hours as needed for wheezing or shortness of breath.    [provider]  atorvastatin (LIPITOR) 10 MG tablet Take 10 mg by mouth daily.    [provider]  chlorhexidine  (PERIDEX ) 0.12 % solution Use as directed 15 mLs in the mouth or throat 2 (two) times daily. 03/20/24   Sheryle Hamilton, DMD  hydrochlorothiazide  25 MG tablet Take 25 mg by mouth daily.      [provider]  meloxicam (MOBIC) 15 MG tablet Take 15 mg by mouth daily as needed (Back Pain). 01/17/24   [provider]  metformin (FORTAMET) 500 MG (OSM) 24 hr tablet Take 500 mg by mouth daily with breakfast.    [provider]  Vitamin D,  Ergocalciferol, (DRISDOL) 1.25 MG (50000 UNIT) CAPS capsule Take 50,000 Units by mouth once a week. 12/19/23   [provider]    Allergies: Rice    Review of Systems  Updated Vital Signs BP 123/74 (BP Location: Left Arm)   Pulse 81   Temp 97.6 F (36.4 C) (Oral)   Resp 18   Ht 1.575 m (5' 2)   Wt 107 kg   LMP 12/09/2015   SpO2 97%   BMI 43.15 kg/m   Physical Exam CONSTITUTIONAL: Well developed/well nourished, no distress HEAD: Normocephalic/atraumatic ENMT: Mucous membranes moist NECK: supple no meningeal signs CV: S1/S2 noted, no murmurs/rubs/gallops noted LUNGS: Lungs are clear to auscultation bilaterally, no apparent distress ABDOMEN: soft, mild suprapubic tenderness, no rebound or guarding, bowel sounds noted throughout abdomen GU:no cva tenderness NEURO: Pt is awake/alert/appropriate, moves all extremitiesx4.  No facial droop.   EXTREMITIES: pulses normal/equal, full ROM SKIN: warm, color normal PSYCH: no abnormalities of mood noted, alert and oriented to situation  (all labs ordered are listed, but only abnormal results are displayed) Labs Reviewed  URINALYSIS, ROUTINE W REFLEX MICROSCOPIC - Abnormal; Notable for the following components:      Result Value   APPearance HAZY (*)    Hgb urine dipstick MODERATE (*)    Protein, ur 30 (*)  Leukocytes,Ua SMALL (*)    Bacteria, UA RARE (*)    All other components within normal limits    EKG: None  Radiology: No results found.   Procedures   Medications Ordered in the ED  cephALEXin  (KEFLEX ) capsule 500 mg (has no administration in time range)                                    Medical Decision Making Amount and/or Complexity of Data Reviewed Labs: ordered.  Risk Prescription drug management.   Patient presents with frequency and pressure and burning after urination.  Given her urinalysis results, will treat for simple UTI.  Patient is in no acute distress, denies any fevers or vomiting.   Will defer further workup and no indication for CT imaging at this time.  We discussed strict ER return precautions.     Final diagnoses:  Acute cystitis without hematuria    ED Discharge Orders          Ordered    cephALEXin  (KEFLEX ) 500 MG capsule  3 times daily        04/01/24 0449               Midge Golas, MD 04/01/24 [id number]

## 2024-04-01 NOTE — ED Triage Notes (Signed)
 Pt with c/o pressure with urination, pain during urination, and burning after urination.

## 2024-04-02 NOTE — Telephone Encounter (Signed)
 PA approved via [facility] [id number]  DOS: 04/05/2024-07/04/2024

## 2024-04-04 ENCOUNTER — Other Ambulatory Visit (HOSPITAL_COMMUNITY)

## 2024-04-09 ENCOUNTER — Telehealth: Payer: Self-pay | Admitting: *Deleted

## 2024-04-09 NOTE — Telephone Encounter (Signed)
 Pt's cancelling her procedure for 04/12/24. She says she has no one to come with her to her procedure. She will call back to reschedule if she finds someone to go with her.

## 2024-04-10 ENCOUNTER — Encounter (HOSPITAL_COMMUNITY): Admission: RE | Admit: 2024-04-10 | Source: Ambulatory Visit

## 2024-04-12 ENCOUNTER — Ambulatory Visit (HOSPITAL_COMMUNITY): Admission: RE | Admit: 2024-04-12 | Source: Home / Self Care | Admitting: Internal Medicine

## 2024-04-12 ENCOUNTER — Encounter (HOSPITAL_COMMUNITY): Admission: RE | Payer: Self-pay | Source: Home / Self Care

## 2024-04-12 SURGERY — COLONOSCOPY
Anesthesia: Choice

## 2024-07-17 ENCOUNTER — Encounter: Payer: Self-pay | Admitting: Gynecologic Oncology

## 2024-07-19 ENCOUNTER — Inpatient Hospital Stay: Payer: Medicaid Other | Attending: Gynecologic Oncology | Admitting: Gynecologic Oncology

## 2024-07-19 ENCOUNTER — Encounter: Payer: Self-pay | Admitting: Gynecologic Oncology

## 2024-07-19 VITALS — BP 132/86 | HR 97 | Temp 98.4°F | Resp 20 | Wt 244.2 lb

## 2024-07-19 DIAGNOSIS — C541 Malignant neoplasm of endometrium: Secondary | ICD-10-CM

## 2024-07-19 DIAGNOSIS — Z8542 Personal history of malignant neoplasm of other parts of uterus: Secondary | ICD-10-CM | POA: Insufficient documentation

## 2024-07-19 NOTE — Patient Instructions (Signed)
 It was good to see you today.  I do not see or feel any evidence of cancer recurrence on your exam.  Please make a follow-up with your OB/GYN in 6 months.  We will see you for follow-up in 12 months.  As always, if you develop any new and concerning symptoms before your next visit, please call to see me sooner.

## 2024-07-19 NOTE — Progress Notes (Signed)
 Gynecologic Oncology Return Clinic Visit  07/19/2024  Reason for Visit: surveillance  Treatment History: Oncology History  Endometrial cancer (HCC)  12/15/2022 Initial Diagnosis   Endometrial cancer (HCC)   12/29/2022 Surgery   Robotic-assisted laparoscopic total hysterectomy with bilateral salpingoophorectomy, SLN biopsy on the right, pelvic lymph node dissection on the left, lysis of adhesions for approximately 20 minutes Extreme morbid obesity requiring additional OR personnel for positioning and retraction. Obesity made retroperitoneal visualization limited and increased the complexity of the case and necessitated additional instrumentation for retraction. Obesity related complexity increased the duration of the procedure by 45 minutes.    12/29/2022 Pathology Results   Stage IA1 grade 1 endometrioid Negative SLN R, pelvic Lns left No LVI Initial biopsy showed p53 WT, loss of MSH6 expression  Germline testing negative   01/06/2023 Genetic Testing   Negative genetic testing on the CancerNext-Expanded+RNAinsight.  The report date is January 06, 2023.  The CancerNext-Expanded gene panel offered by [facility] and includes sequencing and rearrangement analysis for the following 77 genes: AIP, ALK, APC*, ATM*, AXIN2, BAP1, BARD1, BMPR1A, BRCA1*, BRCA2*, BRIP1*, CDC73, CDH1*, CDK4, CDKN1B, CDKN2A, CHEK2*, CTNNA1, DICER1, FH, FLCN, KIF1B, LZTR1, MAX, MEN1, MET, MLH1*, MSH2*, MSH3, MSH6*, MUTYH*, NF1*, NF2, NTHL1, PALB2*, PHOX2B, PMS2*, POT1, PRKAR1A, PTCH1, PTEN*, RAD51C*, RAD51D*, RB1, RET, SDHA, SDHAF2, SDHB, SDHC, SDHD, SMAD4, SMARCA4, SMARCB1, SMARCE1, STK11, SUFU, TMEM127, TP53*, TSC1, TSC2, and VHL (sequencing and deletion/duplication); EGFR, EGLN1, HOXB13, KIT, MITF, PDGFRA, POLD1, and POLE (sequencing only); EPCAM and GREM1 (deletion/duplication only). DNA and RNA analyses performed for * genes.      Interval History: Doing well.  Denies any vaginal bleeding or discharge.  Reports  baseline bowel and bladder symptoms.  Denies any pain.  Past Medical/Surgical History: Past Medical History:  Diagnosis Date   Arthritis    Asthma    rare rescue inhaler use (not since 2021)   Borderline diabetes    Bronchitis    Cancer (HCC)    Family history of breast cancer    Family history of uterine cancer    Hyperlipidemia    Hypertension    Obesity    Pre-diabetes    Uterine cancer (HCC)     Past Surgical History:  Procedure Laterality Date   CHOLECYSTECTOMY     COLONOSCOPY N/A 07/15/2014   Procedure: COLONOSCOPY;  Surgeon: Margo LITTIE Haddock, MD;  Location: [facility];  Service: Endoscopy;  Laterality: N/A;  9:30 AM - moved to 1:00 - Ginger to notify pt   LYMPH NODE DISSECTION N/A 12/29/2022   Procedure: LYMPH NODE DISSECTION;  Surgeon: Viktoria Comer SAUNDERS, MD;  Location: [facility];  Service: Gynecology;  Laterality: N/A;   REDUCTION MAMMAPLASTY Bilateral    ROBOTIC ASSISTED TOTAL HYSTERECTOMY WITH BILATERAL SALPINGO OOPHERECTOMY N/A 12/29/2022   Procedure: XI ROBOTIC ASSISTED TOTAL HYSTERECTOMY WITH BILATERAL SALPINGO OOPHORECTOMY;  Surgeon: Viktoria Comer SAUNDERS, MD;  Location: [facility];  Service: Gynecology;  Laterality: N/A;   SENTINEL NODE BIOPSY N/A 12/29/2022   Procedure: SENTINEL NODE BIOPSY;  Surgeon: Viktoria Comer SAUNDERS, MD;  Location: [facility];  Service: Gynecology;  Laterality: N/A;   TOOTH EXTRACTION N/A 03/20/2024   Procedure: DENTAL RESTORATION/EXTRACTIONS;  Surgeon: Sheryle Hamilton, DMD;  Location: [facility];  Service: Oral Surgery;  Laterality: N/A;    Family History  Problem Relation Age of Onset   Cervical cancer Mother    Fibrocystic breast disease Maternal Grandmother    Breast cancer Maternal Grandmother    Other Maternal Grandfather  MVA   Osteoporosis Daughter    Multiple sclerosis Daughter    Breast cancer Other        MGMs sister   Pancreatic cancer Other        MGMs brother   Diabetes Half-Sister    Heart disease Half-Sister    Lupus  Half-Sister    Uterine cancer Half-Sister 48   Heart attack Half-Brother    Stroke Half-Brother    Diabetes Half-Brother     Social History   Socioeconomic History   Marital status: Single    Spouse name: Not on file   Number of children: 3   Years of education: Not on file   Highest education level: High school graduate  Occupational History   Not on file  Tobacco Use   Smoking status: Former    Current packs/day: 0.00    Types: Cigarettes    Quit date: 2006    Years since quitting: 19.9   Smokeless tobacco: Never  Vaping Use   Vaping status: Never Used  Substance and Sexual Activity   Alcohol use: No   Drug use: No   Sexual activity: Not Currently    Birth control/protection: Post-menopausal  Other Topics Concern   Not on file  Social History Narrative   Not on file   Social Drivers of Health   Tobacco Use: Medium Risk (07/19/2024)   Patient History    Smoking Tobacco Use: Former    Smokeless Tobacco Use: Never    Passive Exposure: Not on file  Financial Resource Strain: High Risk (01/03/2024)   Overall Financial Resource Strain (CARDIA)    Difficulty of Paying Living Expenses: Very hard  Food Insecurity: Food Insecurity Present (01/03/2024)   Hunger Vital Sign    Worried About Running Out of Food in the Last Year: Never true    Ran Out of Food in the Last Year: Sometimes true  Transportation Needs: Unmet Transportation Needs (01/03/2024)   PRAPARE - Transportation    Lack of Transportation (Medical): Yes    Lack of Transportation (Non-Medical): Yes  Physical Activity: Insufficiently Active (01/03/2024)   Exercise Vital Sign    Days of Exercise per Week: 2 days    Minutes of Exercise per Session: 20 min  Stress: No Stress Concern Present (01/03/2024)   [employer] of Occupational Health - Occupational Stress Questionnaire    Feeling of Stress : Only a little  Social Connections: Moderately Isolated (01/03/2024)   Social Connection and Isolation Panel     Frequency of Communication with Friends and Family: More than three times a week    Frequency of Social Gatherings with Friends and Family: Once a week    Attends Religious Services: 1 to 4 times per year    Active Member of Clubs or Organizations: No    Attends Banker Meetings: Never    Marital Status: Never married  Depression (PHQ2-9): High Risk (01/03/2024)   Depression (PHQ2-9)    PHQ-2 Score: 14  Alcohol Screen: Low Risk (09/20/2022)   Alcohol Screen    Last Alcohol Screening Score (AUDIT): 0  Housing: Low Risk (01/03/2024)   Housing Stability Vital Sign    Unable to Pay for Housing in the Last Year: No    Number of Times Moved in the Last Year: 0    Homeless in the Last Year: No  Utilities: Not At Risk (01/03/2024)   [facility] Utilities    Threatened with loss of utilities: No  Health Literacy: Adequate Health Literacy (01/03/2024)  B1300 Health Literacy    Frequency of need for help with medical instructions: Never    Current Medications: Current Medications[1]  Review of Systems: + Ringing in ears, leg swelling, joint pain, muscle pain/cramps, back pain, headache, problems with walking, swollen glands/lymph nodes Denies appetite changes, fevers, chills, fatigue, unexplained weight changes. Denies hearing loss, neck lumps or masses, mouth sores or voice changes. Denies cough or wheezing.  Denies shortness of breath. Denies chest pain or palpitations.  Denies abdominal distention, pain, blood in stools, constipation, diarrhea, nausea, vomiting, or early satiety. Denies pain with intercourse, dysuria, frequency, hematuria or incontinence. Denies hot flashes, pelvic pain, vaginal bleeding or vaginal discharge.   Denies itching, rash, or wounds. Denies dizziness, numbness or seizures. Denies easy bruising or bleeding. Denies anxiety, depression, confusion, or decreased concentration.  Physical Exam: BP (!) 155/89 (BP Location: Left Arm, Patient Position: Sitting)    Pulse 97   Temp 98.4 F (36.9 C) (Oral)   Resp 20   Wt 244 lb 3.2 oz (110.8 kg)   LMP 12/09/2015   SpO2 99%   BMI 44.66 kg/m  General: Alert, oriented, no acute distress. HEENT: Normocephalic, atraumatic, sclera anicteric.  Shotty lymph node under the left mandible. Chest: Clear to auscultation bilaterally.  No wheezes or rhonchi. Cardiovascular: Regular rate and rhythm, no murmurs. Abdomen: Obese, soft, nontender.  Normoactive bowel sounds.  No masses or hepatosplenomegaly appreciated.  Well-healed incisions with keloids of each incision. Extremities: Grossly normal range of motion.  Warm, well perfused.  No edema bilaterally. Skin: No rashes or lesions noted. Lymphatics: No cervical, supraclavicular, or inguinal adenopathy. GU: Normal appearing external genitalia without erythema, excoriation, or lesions.  Speculum exam reveals well rugated vaginal mucosa, no lesions.  Cuff intact without lesions.  Bimanual exam reveals no masses or nodularity.  Laboratory & Radiologic Studies: None new  Assessment & Plan: Allison [name] is a 58 y.o. woman with Stage IA1 low-grade endometrioid endometrial adenocarcinoma who presents for surveillance. Surgery 11/2022. P53 WT, loss of MSH6, germline testing negative.   Doing well.  NED on exam today.   Per NCCN surveillance recommendations, we will plan on visits every 6 months.  We will alternate these between my office and her OB/GYN initially for the first 2-3 years and then transition to visits with her OB/GYN.  We will plan to see her in 1 year.  We discussed signs and symptoms that would be concerning for cancer recurrence.  I stressed the importance of calling if she develops these between visits.  20 minutes of total time was spent for this patient encounter, including preparation, face-to-face counseling with the patient and coordination of care, and documentation of the encounter.  Comer Dollar, MD  Division of Gynecologic Oncology   Department of Obstetrics and Gynecology  [facility]      [1]  Current Outpatient Medications:    albuterol  (VENTOLIN  HFA) 108 (90 Base) MCG/ACT inhaler, Inhale 2 puffs into the lungs every 6 (six) hours as needed for wheezing or shortness of breath., Disp: , Rfl:    atorvastatin (LIPITOR) 10 MG tablet, Take 10 mg by mouth daily., Disp: , Rfl:    cetirizine (ZYRTEC) 10 MG tablet, Take 1 tablet every day by oral route as needed, for runny nose and/or allergy sxs., Disp: , Rfl:    hydrochlorothiazide  25 MG tablet, Take 25 mg by mouth daily.  , Disp: , Rfl:    metformin (FORTAMET) 500 MG (OSM) 24 hr tablet, Take 500  mg by mouth daily with breakfast., Disp: , Rfl:

## 2024-07-26 ENCOUNTER — Ambulatory Visit: Payer: Medicaid Other | Admitting: Gynecologic Oncology

## 2024-07-29 ENCOUNTER — Encounter (INDEPENDENT_AMBULATORY_CARE_PROVIDER_SITE_OTHER): Payer: Self-pay | Admitting: *Deleted

## 2024-08-27 ENCOUNTER — Ambulatory Visit: Admitting: Obstetrics & Gynecology

## 2024-09-16 ENCOUNTER — Ambulatory Visit: Admitting: Obstetrics & Gynecology

## 2025-07-01 ENCOUNTER — Inpatient Hospital Stay: Admitting: Gynecologic Oncology
